# Patient Record
Sex: Male | Born: 1950
Health system: Southern US, Community
[De-identification: ages and names within clinical notes are randomized; demographics above are authoritative.]

## PROBLEM LIST (undated history)

## (undated) DIAGNOSIS — N400 Enlarged prostate without lower urinary tract symptoms: Secondary | ICD-10-CM

## (undated) DIAGNOSIS — I10 Essential (primary) hypertension: Secondary | ICD-10-CM

## (undated) DIAGNOSIS — E119 Type 2 diabetes mellitus without complications: Secondary | ICD-10-CM

## (undated) HISTORY — PX: ROTATOR CUFF REPAIR: SHX139

## (undated) HISTORY — PX: CHOLECYSTECTOMY: SHX55

---

## 2001-12-25 ENCOUNTER — Inpatient Hospital Stay (HOSPITAL_COMMUNITY): Admission: EM | Admit: 2001-12-25 | Discharge: 2001-12-30 | Payer: Self-pay | Admitting: *Deleted

## 2001-12-25 ENCOUNTER — Encounter: Payer: Self-pay | Admitting: Surgery

## 2001-12-25 ENCOUNTER — Encounter (INDEPENDENT_AMBULATORY_CARE_PROVIDER_SITE_OTHER): Payer: Self-pay | Admitting: *Deleted

## 2001-12-27 ENCOUNTER — Encounter: Payer: Self-pay | Admitting: Surgery

## 2001-12-28 ENCOUNTER — Encounter: Payer: Self-pay | Admitting: Surgery

## 2007-11-02 ENCOUNTER — Inpatient Hospital Stay (HOSPITAL_COMMUNITY): Admission: AD | Admit: 2007-11-02 | Discharge: 2007-11-07 | Payer: Self-pay | Admitting: Internal Medicine

## 2008-09-12 ENCOUNTER — Emergency Department (HOSPITAL_COMMUNITY): Admission: EM | Admit: 2008-09-12 | Discharge: 2008-09-12 | Payer: Self-pay | Admitting: Family Medicine

## 2011-03-15 NOTE — Discharge Summary (Signed)
NAME:  Brian Savage, Brian Savage NO.:  [id number]   MEDICAL RECORD NO.:  [id number]          PATIENT TYPE:  INP   LOCATION:  5114                         FACILITY:  [facility]   PHYSICIAN:  Thora Lance, M.D.  DATE OF BIRTH:  Jul 09, 1951   DATE OF ADMISSION:  11/02/2007  DATE OF DISCHARGE:  11/07/2007                               DISCHARGE SUMMARY   REASON FOR ADMISSION:  A 60 year old white male who presented with a  five day history of fever, shortness of breath, cough.  He was seen in  our office and found to have a temperature of 103 and bilateral lower  lobe infiltrates on chest x-ray.   PHYSICAL EXAMINATION:  VITAL SIGNS:  Blood pressure 130/70, temperature  103.  CHEST:  Decreased breath sounds and rhonchi at the bases.  HEART:  Regular rate and rhythm.  ABDOMEN: Nontender.  EXTREMITIES:  No edema.   CLINICAL DATA:  Chest x-ray showed a lingular consolidation consistent  with pneumonia.   HOSPITAL COURSE:  PNEUMONIA:  The patient was admitted for a left lung  pneumonia.  He had some mild hyponatremia with sodium of 128 on  admission.  This was treated with saline and resolved.  He was placed on  IV Rocephin and azithromycin as well as nebulizer's and oxygen.  He  gradually got better an defervesced by November 05, 2006. His white count  normalized.  His sputum culture grew out Moraxella catarrhalis, beta  lactamase positive and he was switched to Zosyn and continued on  azithromycin.  At discharge he was ambulating well without dyspnea. He  was afebrile and feeling much better. He had saturation's in the mid  90's on room air.   DISCHARGE DIAGNOSES:  1. Left lung pneumonia.  2. Hypertension.   PROCEDURE:  None.   DISCHARGE MEDICATIONS:  1. Augmentin 875 mg one p.o. q.12h., 5 days.  2. Azithromycin 500 mg one p.o. daily, 2 days.  3. Micardis/hydrochlorothiazide one a day.  4. Aspirin once a day.   DISPOSITION:  Discharged to home.   FOLLOWUP:  One week  with Dr. Valentina Lucks.   DIET:  Low sodium diet.   ACTIVITY:  As tolerated.           ______________________________  Thora Lance, M.D.     JJG/MEDQ  D:  11/07/2007  T:  11/07/2007  Job:  [id number]

## 2011-03-15 NOTE — H&P (Signed)
NAME:  Brian Savage, Brian Savage NO.:  [id number]   MEDICAL RECORD NO.:  [id number]          PATIENT TYPE:  INP   LOCATION:  5114                         FACILITY:  [facility]   PHYSICIAN:  Deirdre Peer. Polite, M.D. DATE OF BIRTH:  [birth year]   DATE OF ADMISSION:  11/02/2007  DATE OF DISCHARGE:                              HISTORY & PHYSICAL   CHIEF COMPLAINT:  Fever, shortness of breath.   HISTORY OF PRESENT ILLNESS:  A 60 year old male with known history of  hypertension, obesity and glucose intolerance who presents to the office  with above chief complaint.  The patient has been having those symptoms  for approximately 5 days.  He has tried several over-the-counter  medicine without improvement.  He is having productive cough, having  pain in his chest because of excessive coughing.  In the office he is  evaluated.  Temperature is 103, weight 254, pulse 100, and BP 130/70.  He had an x-ray with bilateral lower lobe infiltrate.  Admission was  deemed necessary for further evaluation and treatment.  Please note, the  patient for the most part has not had any sick contacts; however, has  been traveling a lot recently and in fact had a flight to [place].   PAST MEDICAL HISTORY:  As stable above.   MEDICATIONS:  1. Micardis HCT 80/25 daily.  2. Multivitamin daily.  3. Aspirin daily.   SOCIAL HISTORY:  Negative for tobacco.  Social alcohol, no drugs.   ALLERGIES:  None.   PAST SURGICAL HISTORY:  Fractured left collar bone in 1985, open  cholecystectomy in 2003.   REVIEW OF SYSTEMS:  As stated in the HPI.   FAMILY HISTORY:  Father with diabetes.  Mother deceased from stroke;  also had hypertension, diabetes.  Several siblings with hypertension.   PHYSICAL EXAMINATION:  The patient in moderate distress with paroxysms  of cough.  BP 130/70, temperature 103.  HEENT:  Anicteric sclerae, moist oral mucosa.  NECK:  Supple, no adenopathy.  CHEST:  Decreased breath sounds and  rhonchi in the base.  CARDIOVASCULAR:  Regular, no S3.  ABDOMEN:  Obese, nontender.  EXTREMITIES:  No edema.  NEUROLOGIC:  Nonfocal.   Chest x-ray:  Stated in HPI.   ASSESSMENT:  1. Pneumonia.  2. Fever.  3. Hypoxia, 88% on room air in the office.  4. Hypertension.  5. Obesity.   RECOMMENDATIONS:  The patient admitted to the hospital, will be started  on antibiotics, O2 nebulizers.  Will have a followup chest x-ray.  Will  have labs CBC, CMET, and the patient will be pancultured.  With the  patient's temperature being as high a 103, will also add nasopharyngeal  swab for influenza.      Deirdre Peer. Polite, M.D.  Electronically Signed     RDP/MEDQ  D:  11/02/2007  T:  11/02/2007  Job:  [id number]

## 2011-03-18 NOTE — Procedures (Signed)
[facility]  Patient:    Brian Savage, Brian Savage Visit Number: [id number] MRN: [id number]          Service Type: SUR Location: 4W 0449 01 Attending Physician:  Bonnetta Barry Dictated by:   Petra Kuba, M.D. Proc. Date: 12/28/01 Admit Date:  12/25/2001   CC:         Thora Lance, M.D.  Velora Heckler, M.D.   Procedure Report  PROCEDURE:  Endoscopic retrograde cholangiopancreatogram with sphincterotomy stone extraction.  INDICATION:  CBD stones on intraoperative cholangiogram. Consent was signed after risks, benefits, methods, and options were thoroughly discussed by both Dr. Luther Parody and Dr. Gerrit Friends February 27 and Dr. Ewing Schlein before any premedications given.  MEDICINES USED:  Demerol 60 mg, Versed 6 mg.  DESCRIPTION OF PROCEDURE:  The side-viewing therapeutic video duodenoscope was inserted by indirect vision into the stomach. There was some NG trauma seen. Some minimal bilious secretions were suctioned. The scope was then advanced through a normal antrum, normal pylorus, into the duodenum and a normal appearing ampulla was brought into view. On the first attempt using the triple lumen sphincter tome, deep selective cannulation was obtained. The CBD was normal upon the first injection without obvious stones. The JAG wire was advanced into the intraphepatics and we went ahead and proceeded with a moderate size sphincterotomy until excellent biliary drainage was seen and until we could advance the fully _______ sphincterotome in and out of the duct easily. We then went ahead and exchanged this sphincterotome for an 8.5-mm balloon and on three balloon pullthroughs, no abnormalities were seen or delivered. The balloon was withdrawn with no more resistance through the sphincterotomy site. We did do an occlusion cholangiogram which was normal. Dr. Kara Pacer reviewed the films with [place] after the procedure without any obvious residual stones or  abnormalities. After the third balloon pullthrough, the wire was withdrawn, the scope removed, the patient tolerated the procedure well, and there was no obvious immediate complication.  ENDOSCOPIC DIAGNOSES: 1. Nasogastric trauma in the stomach. 2. Normal ampulla. 3. Normal common bile duct and intrahepatics without obvious being seen. 4. No pancreatic duct injections.  THERAPY:  Moderate size sphincterotomy with three balloon pullthroughs using the 8.5 mm balloon and a negative occlusion cholangiogram.  PLAN:  Customary post ERCP orders. Will hold NG tube for now. I have discussed that with Dr. Gerrit Friends but have a low threhhold for replacing. Continue antibiotics for at least one more day and recheck labs in the a.m. and otherwise, further workup and plans per Dr. Gerrit Friends and the customary postoperative care. Dictated by:   Petra Kuba, M.D. Attending Physician:  Bonnetta Barry DD:  12/28/01 TD:  12/30/01 Job: [id number]

## 2011-03-18 NOTE — Op Note (Signed)
[facility]  Patient:    Brian Savage, Brian Savage Visit Number: [id number] MRN: [id number]          Service Type: SUR Location: 4W 0449 01 Attending Physician:  Bonnetta Barry Dictated by:   Velora Heckler, M.D. Proc. Date: 12/27/01 Admit Date:  12/25/2001   CC:         Thora Lance, M.D.   Operative Report  PREOPERATIVE DIAGNOSES:  Cholelithiasis, biliary pancreatitis.  POSTOPERATIVE DIAGNOSES:  Cholelithiasis, subacute cholecystitis, biliary pancreatitis, choledocholithiasis.  PROCEDURE: 1. Attempted laparoscopic cholecystectomy with intraoperative cholangiography. 2. Open cholecystectomy with intraoperative cholangiography.  SURGEON:  Velora Heckler, M.D.  ASSISTANT:  Adolph Pollack, M.D.  ANESTHESIA:  General.  ESTIMATED BLOOD LOSS:  Less than 200 cc.  PREPARATION:  Betadine.  COMPLICATIONS:  None.  INDICATIONS:  The patient is a 60 year old black male, who developed his first episode of biliary colic and subsequent biliary pancreatitis during a recent trip to [place].  He was treated conservatively with bowel rest and intravenous hydration.  The patient returned to the [place] and was seen by general surgery.  He was prepared for elective cholecystectomy on an outpatient basis.  However, in the intervening weeks, the patient developed a second episode of biliary pancreatitis.  The patient was admitted to [facility] on December 25, 2001, and treated with bowel rest and intravenous hydration.  The patient was prepared and brought to the operating room today for cholecystectomy.  DESCRIPTION OF PROCEDURE:  The procedure [facility] in [facility].  The patient is brought to the operating room and placed in a supine position on the operating room table.  Following administration of general anesthesia, the patient is prepped and draped in the usual strict aseptic fashion.  After  ascertaining that an adequate level of anesthesia had been obtained, an infraumbilical incision was made with a #15 blade. Dissection is carried down to the fascia.  The fascia is incised in the midline, and the peritoneal cavity is entered cautiously.  An 0 Vicryl pursestring suture is placed in the fascia.  An Hasson cannula is introduced under direct vision and secured with the pursestring suture.  The abdomen is insufflated with carbon dioxide.  Laparoscope is introduced under direct visualization, and the abdomen is explored.  Operative ports are placed along the right costal margin in the midline, mid clavicular line, and anterior axillary line.  Fundus of the gallbladder is identified.  There are adhesions of the omentum to the undersurface of the liver.  These are carefully taken down with blunt dissection and hemostasis obtained with the electrocautery. The fundus of the gallbladder is grasped and retracted cephalad.  Omental adhesions to the gallbladder are gently taken down using blunt dissection and hemostasis again obtained with the electrocautery.  Dissection is begun at the neck of the gallbladder.  Venous tributaries are divided between Ligaclips. The neck of the gallbladder is gently dissected out, mainly with blunt dissection.  A structure resembling the cystic duct is dissected out.  It is moderately dilated.  After opening up the triangle of Calot sufficiently to convince ourselves that this represented the cystic duct, a clip was placed at the neck of the gallbladder.  Cystic duct was incised.  Numerous small, subcentimeter gallstones were extracted from the cystic duct.  A Cook cholangiography catheter is introduced through the skin in the right upper quadrant of the abdominal wall.  It is inserted into the cystic duct  and secured with a Ligaclip.  Using C-arm fluoroscopy, real-time cholangiography is performed.  There was filling of the cystic duct, containing  numerous gallstones.  Common bile duct was not visualized.  There was reflux of contrast into the gallbladder.  At this point, a decision was made to open the patient in order to better define the anatomy and rule out potential common bile duct injury.  Therefore, pneumoperitoneum was released.  All ports were removed.  Cook catheter was removed.  The 0 Vicryl pursestring suture at the umbilicus was tied securely.  The patient was repositioned.  Instruments were reset.  Right subcostal incision was made with a #10 blade.  Dissection was carried down through the subcutaneous tissues.  Fascia was incised.  Rectus muscle was divided with the electrocautery.  Posterior rectus fascia was incised, and the peritoneal cavity was entered cautiously.  Previous irrigation fluid was evacuated.  Thompson retractor was placed for exposure. Gallbladder was examined directly.  Indeed the anatomy was, as suspected, that the gallbladder had been dissected out down to the neck of the gallbladder. Cystic duct had been clipped and incised.  The cystic duct still contained numerous gallstones which were extracted.  Gallbladder was mobilized from its fundus.  It was excised from the gallbladder bed.  Cystic artery was controlled with a 3-0 silk ligature.  Gallbladder was completely mobilized down to the cystic duct.  A clamp was placed at the neck of the gallbladder to prevent further spillage of gallstones.  Using a Vanderbilt clamp, the cystic duct was progressively dilated.  Approximately four further chololiths were removed from the cystic duct, and there was free flow of bile then emanating from the cystic duct.  Cook cholangiography catheter was inserted into the cystic duct and secured with a Ligaclip.  Using C-arm fluoroscopy, real-time cholangiography was performed.  The cystic duct was quite circuitous.  The common bile duct was filled with contrast into the left and right ductal systems.  There were  numerous filling defects distally in the common bile  duct.  With some pressure on the contrast, there was flow of contrast into the duodenum.  Cook catheter was withdrawn.  Decision was made not to perform common bile duct exploration given the severity of inflammatory change due to underlying pancreatitis.  Therefore, the cystic duct was doubly ligated with 2-0 silk ligatures, and the gallbladder was excised completely.  Right upper quadrant was irrigated copiously with warm saline which was evacuated.  Good hemostasis was noted.  Three chololiths were removed from the peritoneal cavity.  Nasogastric tube was properly positioned within the stomach. Abdominal wall was closed in two layers with running #1 PDS suture.  The subcutaneous tissues were irrigated.  Skin was closed with stainless steel staples at all abdominal wounds.  The wounds were washed and dried, and sterile dressings were applied.  The patient was awakened from anesthesia and brought to the recovery room in stable condition.  The patient tolerated the procedure well.     Dictated by:   Velora Heckler, M.D. Attending Physician:  Bonnetta Barry DD:  12/27/01 TD:  12/27/01 Job: [id number]

## 2011-03-18 NOTE — Discharge Summary (Signed)
[facility]  Patient:    Brian Savage, Brian Savage Visit Number: [id number] MRN: [id number]          Service Type: SUR Location: 4W 0449 01 Attending Physician:  Bonnetta Barry Dictated by:   Velora Heckler, M.D. Admit Date:  12/25/2001 Discharge Date: 12/30/2001                             Discharge Summary  REASON FOR ADMISSION:  Recurrent biliary pancreatitis.  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  Patient had experienced an initial bout of biliary pancreatitis while traveling in [place].  [place] his [place] the [place], he was seen in my general surgery practice for elective cholecystectomy. Patient was scheduled for surgery in early March 2003.  However, on the evening of December 25, 2001, he presented to the emergency room at [facility] with abdominal pain.  Evaluation showed an elevated lipase and elevated liver function test.  Patient was admitted to the general surgical service.  HOSPITAL COURSE:  Patient was admitted on the general surgical service and placed on intravenous hydration.  Patient had improvement in his abdominal pain.  Liver function tests began to return towards normal levels as did his amylase and lipase levels.  The patient was therefore prepared and taken to the operating room on February 27.  He underwent attempted laparoscopic cholecystectomy with conversion to open cholecystectomy with intraoperative cholangiography.  He was found to have choledocholithiasis at the time of operation.  Patient was seen postoperatively by Dr. Roosvelt Harps from the gastroenterology service.  Patient was prepared and underwent endoscopic retrograde cholangiopancreatography with sphincterotomy and stone extraction on February 28.  Patients postoperative course was uneventful.  LFTs returned towards normal levels.  The patient became asymptomatic and  tolerated a diet. He was prepared for discharge home on December 30, 2001.  DISCHARGE PLANNING:  Patient is discharged home December 30, 2001, in good condition, tolerating a low-fat diet, and ambulating independently.  Patient will be seen back in my office at [facility] in one week. Discharge medications include Tylox as needed for pain.  FINAL DIAGNOSIS:  Acute cholecystitis, cholelithiasis, choledocholithiasis, biliary pancreatitis.  CONDITION AT DISCHARGE:  Improved. Dictated by:   Velora Heckler, M.D. Attending Physician:  Bonnetta Barry DD:  01/23/02 TD:  01/23/02 Job: [id number]

## 2011-03-18 NOTE — Consult Note (Signed)
[facility]  Patient:    Brian Savage, PIDCOCK Visit Number: [id number] MRN: [id number]          Service Type: SUR Location: 4W 0449 01 Attending Physician:  Bonnetta Barry Dictated by:   Roosvelt Harps, M.D. Proc. Date: 12/27/01 Admit Date:  12/25/2001   CC:         Velora Heckler, M.D.  Thora Lance, M.D.  Florencia Reasons, M.D.   Consultation Report  HISTORY:  Mr. Walker Shadow is a 60 year old male with a reported history of recurrent gallstone pancreatitis with an ultrasound that showed a gallbladder filled with multiple large stones.  He was admitted for recurrent pancreatitis and underwent an attempt at a laparoscopic cholecystectomy when the pancreatitis had seemingly resolved this morning.  Laparoscopic procedure was unsuccessful and it was converted to an open cholecystectomy.  By report intraoperative cholangiogram demonstrated that there were still stones within the common bile duct which could not be removed surgically.  Patient currently is in moderate pain.  Laboratory tests reveal normal amylase, lipase, and bilirubin.  His SGOT has decreased to 33 from 168 and his SGPT to 185 from 483.  White blood count is 12.7.  Prothrombin time is normal.  PAST MEDICAL HISTORY:  Fairly unremarkable except for some orthopedic injuries.  MEDICATIONS:  Outpatient medications were none.  No hospital patient is currently on pain medications and cefazolin IV.  SOCIAL HISTORY:  He is married.  He is a Veterinary surgeon at [employer]. He does not drink and he is a nonsmoker.  FAMILY HISTORY:  Pertinent for cholelithiasis in his mother.  There is no known family history of gastrointestinal neoplasia or inflammatory bowel disease.  REVIEW OF SYSTEMS:  GENERAL:  No weight loss or night sweats.  ENDOCRINE:  No history of diabetes or thyroid problems.  SKIN:  No rash or pruritus.  HEENT: Eyes:  No icterus or change in vision.  ENT:  No aphthous  ulcers or chronic sore throat.  RESPIRATORY:  No shortness of breath, cough, or wheezing. CARDIAC:  No chest pain, palpitations, or history of valvular heart disease. GASTROINTESTINAL:  As above.  GENITOURINARY:  No dysuria or hematuria. Remainder room of review of systems is negative.  PHYSICAL EXAMINATION  GENERAL:  He is in moderate to mild distress.  VITAL SIGNS:  Afebrile, blood pressure 156/90, pulse 103.  SKIN:  Normal.  HEENT:  Eyes:  Anicteric.  Oropharynx is unremarkable.  NECK:  Supple without thyromegaly.  No cervical or inguinal adenopathy.  CHEST:  Clear to auscultation and percussion.  HEART:  Regular rate and rhythm without murmur, rub, or gallop.  ABDOMEN:  Surgical dressings, moderate distention, and no bowel sounds.  There is mild to moderate incisional tenderness.  EXTREMITIES:  Without clubbing, cyanosis, edema, or rash.  IMPRESSION:  Otherwise healthy 60 year old male with recurrent gallstone pancreatitis status post open cholecystectomy with an intraoperative cholangiogram that suggests retained common bile duct stones.  RECOMMENDATIONS:  An ERCP for removal of these stones is in order to help prevent further pancreatitis or infection.  PLAN:  ERCP technique and potential complications including bleeding, infection, and worsening pancreatitis are discussed with the patient and his wife who agree to proceed pending his postoperative condition.  This could be scheduled tomorrow at approximately 3:30 p.m. unless there are earlier cancellations.  He should be maintained on antibiotics until completion of the ERCP. Dictated by:   Roosvelt Harps, M.D. Attending Physician:  Bonnetta Barry DD:  12/27/01 TD:  12/27/01 Job: [id number]

## 2011-07-21 LAB — BASIC METABOLIC PANEL
BUN: 6
BUN: 8
CO2: 29
Calcium: 8.2 — ABNORMAL LOW
Calcium: 8.6
GFR calc non Af Amer: >60
Glucose, Bld: 124 — ABNORMAL HIGH
Glucose, Bld: 175 — ABNORMAL HIGH
Potassium: 3.8
Sodium: 136

## 2011-07-21 LAB — INFLUENZA A+B VIRUS AG-DIRECT(RAPID)
Inflenza A Ag: NEGATIVE
Influenza B Ag: NEGATIVE

## 2011-07-21 LAB — COMPREHENSIVE METABOLIC PANEL
BUN: 11
CO2: 29
Calcium: 8.2 — ABNORMAL LOW
Chloride: 89 — ABNORMAL LOW
Creatinine, Ser: 1.19
GFR calc Af Amer: >60
GFR calc non Af Amer: >60
Glucose, Bld: 136 — ABNORMAL HIGH
Total Bilirubin: 0.6

## 2011-07-21 LAB — EXPECTORATED SPUTUM ASSESSMENT W GRAM STAIN, RFLX TO RESP C

## 2011-07-21 LAB — URINALYSIS, DIPSTICK ONLY
Bilirubin Urine: NEGATIVE
Ketones, ur: NEGATIVE
Nitrite: NEGATIVE
Specific Gravity, Urine: 1.01
Urobilinogen, UA: 1

## 2011-07-21 LAB — CBC
HCT: 36.4 — ABNORMAL LOW
HCT: 37.1 — ABNORMAL LOW
Hemoglobin: 12.3 — ABNORMAL LOW
MCHC: 33.8
MCHC: 34.7
MCV: 87.9
Platelets: 220
Platelets: 283
RBC: 4.22
RDW: 13.4
RDW: 14.1
WBC: 12.1 — ABNORMAL HIGH
WBC: 9.3

## 2011-07-21 LAB — CULTURE, RESPIRATORY W GRAM STAIN

## 2011-07-21 LAB — CULTURE, BLOOD (ROUTINE X 2)
Culture: NO GROWTH
Culture: NO GROWTH

## 2015-05-01 ENCOUNTER — Other Ambulatory Visit: Payer: Self-pay | Admitting: *Deleted

## 2015-05-01 ENCOUNTER — Ambulatory Visit (INDEPENDENT_AMBULATORY_CARE_PROVIDER_SITE_OTHER): Payer: BC Managed Care – PPO | Admitting: Internal Medicine

## 2015-05-01 DIAGNOSIS — Z9189 Other specified personal risk factors, not elsewhere classified: Secondary | ICD-10-CM

## 2015-05-01 DIAGNOSIS — Z7189 Other specified counseling: Secondary | ICD-10-CM | POA: Diagnosis not present

## 2015-05-01 DIAGNOSIS — Z23 Encounter for immunization: Secondary | ICD-10-CM | POA: Diagnosis not present

## 2015-05-01 DIAGNOSIS — Z7184 Encounter for health counseling related to travel: Secondary | ICD-10-CM

## 2015-05-01 MED ORDER — CIPROFLOXACIN HCL 500 MG PO TABS: 500.0000 mg | ORAL_TABLET | Freq: Two times a day (BID) | ORAL | Status: DC

## 2015-05-01 MED ORDER — ATOVAQUONE-PROGUANIL HCL 250-100 MG PO TABS: 1.0000 | ORAL_TABLET | Freq: Every day | ORAL | Status: DC

## 2015-05-01 NOTE — Progress Notes (Signed)
  RFV: travel to [place] for mission trip Subjective:    Patient ID: Brian Savage, male    DOB: 06/08/1951, 64 y.o.   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 He will be leaving on july8th and returning the 18th. Going with group of 23. He was last in [place] in 2014. He has not had traveler's diarrhea in the past  Allergies not on file No current outpatient prescriptions on file prior to visit.   No current facility-administered medications on file prior to visit.   Active Ambulatory Problems    Diagnosis Date Noted  . No Active Ambulatory Problems   Resolved Ambulatory Problems    Diagnosis Date Noted  . No Resolved Ambulatory Problems   No Additional Past Medical History  all: NKMA  Meds: metformin, glimerpiride, amlodipine, micardis, asa, mvi  Pmhx: diabetes, htn  Previous travel: [place], [place], [place], [place], [place]  Previous vax: flu last year, hep a, typhoid inj in 2012, meningococcal , yellow fever, dtap, mmr, small pox 2005, polio adult booster    Review of Systems     Objective:   Physical Exam There were no vitals taken for this visit.        Assessment & Plan:  Vaccines = will give him typhoid injection  Malaria proph= will give him malarone to start on July 6th thru the 25th  Mosquito bite prevention = discussed use of deet and premethrin  Traveler's diarrhea =will give rx for cipro

## 2016-09-08 DIAGNOSIS — Z7984 Long term (current) use of oral hypoglycemic drugs: Secondary | ICD-10-CM | POA: Diagnosis not present

## 2016-09-08 DIAGNOSIS — Z125 Encounter for screening for malignant neoplasm of prostate: Secondary | ICD-10-CM | POA: Diagnosis not present

## 2016-09-08 DIAGNOSIS — E119 Type 2 diabetes mellitus without complications: Secondary | ICD-10-CM | POA: Diagnosis not present

## 2016-09-08 DIAGNOSIS — I10 Essential (primary) hypertension: Secondary | ICD-10-CM | POA: Diagnosis not present

## 2016-09-08 DIAGNOSIS — Z23 Encounter for immunization: Secondary | ICD-10-CM | POA: Diagnosis not present

## 2016-09-08 DIAGNOSIS — Z0000 Encounter for general adult medical examination without abnormal findings: Secondary | ICD-10-CM | POA: Diagnosis not present

## 2017-01-04 DIAGNOSIS — I10 Essential (primary) hypertension: Secondary | ICD-10-CM | POA: Diagnosis not present

## 2017-01-04 DIAGNOSIS — Z7984 Long term (current) use of oral hypoglycemic drugs: Secondary | ICD-10-CM | POA: Diagnosis not present

## 2017-01-04 DIAGNOSIS — E119 Type 2 diabetes mellitus without complications: Secondary | ICD-10-CM | POA: Diagnosis not present

## 2017-01-04 DIAGNOSIS — R972 Elevated prostate specific antigen [PSA]: Secondary | ICD-10-CM | POA: Diagnosis not present

## 2017-02-07 DIAGNOSIS — E119 Type 2 diabetes mellitus without complications: Secondary | ICD-10-CM | POA: Diagnosis not present

## 2017-02-07 DIAGNOSIS — Z7984 Long term (current) use of oral hypoglycemic drugs: Secondary | ICD-10-CM | POA: Diagnosis not present

## 2017-04-25 ENCOUNTER — Encounter: Payer: BC Managed Care – PPO | Attending: Internal Medicine | Admitting: Registered"

## 2017-04-25 DIAGNOSIS — Z713 Dietary counseling and surveillance: Secondary | ICD-10-CM | POA: Diagnosis not present

## 2017-04-25 DIAGNOSIS — E118 Type 2 diabetes mellitus with unspecified complications: Secondary | ICD-10-CM

## 2017-04-25 DIAGNOSIS — E119 Type 2 diabetes mellitus without complications: Secondary | ICD-10-CM | POA: Insufficient documentation

## 2017-04-25 NOTE — Patient Instructions (Addendum)
Check out websites of restaurants and look for nutrition facts for menu items. For the morning high BG you can try having a bedtime snack with a carbohydrate and protein. The other option to help bring down would be to adjust when you take medication - work with your doctor if interested. If BG high, for breakfast think about have more protein. Aim for 3-4 Carb Choices per meal (45-60 grams)   Aim for 0-2 Carbs per snack if hungry  Include protein with your meals and snacks Consider reading food labels for Total Carbohydrate Consider getting daily activity as tolerated Continue checking BG at alternate times per day as directed by MD  Continue taking medication as directed by MD

## 2017-04-25 NOTE — Progress Notes (Signed)
Diabetes Self-Management Education  Visit Type: First/Initial   Appt. Start Time: 1445 Appt. End Time: 1550  04/25/2017  [name], identified by name and date of birth, is a 66 y.o. male with a diagnosis of Diabetes: Type 2.   ASSESSMENT Pt is here with spouse. Pt states he has gym equipment at his home and uses it 2x week for strength training and aerobic exercise. Pt states concern over morning fasting high BG. RD discussed Dawn Effect and Somogyi.     Diabetes Self-Management Education - 04/25/17 1440      Visit Information   Visit Type First/Initial     Initial Visit   Diabetes Type Type 2   Are you currently following a meal plan? No   Are you taking your medications as prescribed? Yes  metformin, glimipiride   Date Diagnosed 2010 or 2011     Health Coping   How would you rate your overall health? Good     Psychosocial Assessment   Patient Belief/Attitude about Diabetes Motivated to manage diabetes   How often do you need to have someone help you when you read instructions, pamphlets, or other written materials from your doctor or pharmacy? 1 - Never   What is the last grade level you completed in school? [name]     Complications   Last HgB A1C per patient/outside source 8.1 %  per pt   How often do you check your blood sugar? 3-4 times/day   Fasting Blood glucose range (mg/dL) 174-944;967-591  [phone]   Postprandial Blood glucose range (mg/dL) 599-357   Number of hypoglycemic episodes per month 0  feels symptoms below 100 - feels tired   Number of hyperglycemic episodes per week 3   Can you tell when your blood sugar is high? No   Have you had a dilated eye exam in the past 12 months? No   Have you had a dental exam in the past 12 months? Yes   Are you checking your feet? Yes   How many days per week are you checking your feet? 7     Dietary Intake   Breakfast none OR oatmeal, black coffee OR bacon sandwich   Snack (morning) none   Lunch fast food  (tuna & veggie, water) [employer], chili [employer] pot pie    Snack (afternoon) none   Dinner [place] OR chinese OR [place] OR salmon OR fried rice   Snack (evening) ice cream OR graham crackers OR yogurt wheat germ   Beverage(s) black coffee, water, diet soda, occassionally wine 2x month     Exercise   Exercise Type Moderate (swimming / aerobic walking)   How many days per week to you exercise? 2   How many minutes per day do you exercise? 25   Total minutes per week of exercise 50     Patient Education   Previous Diabetes Education No   Disease state  Definition of diabetes, type 1 and 2, and the diagnosis of diabetes;Factors that contribute to the development of diabetes   Nutrition management  Role of diet in the treatment of diabetes and the relationship between the three main macronutrients and blood glucose level;Food label reading, portion sizes and measuring food.;Carbohydrate counting;Reviewed blood glucose goals for pre and post meals and how to evaluate the patients' food intake on their blood glucose level.   Monitoring Identified appropriate SMBG and/or A1C goals.   Chronic complications Relationship between chronic complications and blood glucose control     Individualized  Goals (developed by patient)   Nutrition Follow meal plan discussed   Physical Activity Exercise 5-7 days per week     Outcomes   Expected Outcomes Demonstrated interest in learning. Expect positive outcomes   Future DMSE PRN   Program Status Completed    Individualized Plan for Diabetes Self-Management Training:   Learning Objective:  Patient will have a greater understanding of diabetes self-management. Patient education plan is to attend individual and/or group sessions per assessed needs and concerns.   Patient Instructions  Check out websites of restaurants and look for nutrition facts for menu items. For the morning high BG you can try having a bedtime snack with a carbohydrate and protein. The other  option to help bring down would be to adjust when you take medication - work with your doctor if interested. If BG high, for breakfast think about have more protein. Aim for 3-4 Carb Choices per meal (45-60 grams)   Aim for 0-2 Carbs per snack if hungry  Include protein with your meals and snacks Consider reading food labels for Total Carbohydrate Consider getting daily activity as tolerated Continue checking BG at alternate times per day as directed by MD  Continue taking medication as directed by MD   Expected Outcomes:  Demonstrated interest in learning. Expect positive outcomes  Education material provided: Living Well with Diabetes, A1C conversion sheet, My Plate, Support group flyer and Carbohydrate counting sheet  If problems or questions, patient to contact team via:  Phone  Future DSME appointment: PRN

## 2017-06-12 DIAGNOSIS — I10 Essential (primary) hypertension: Secondary | ICD-10-CM | POA: Diagnosis not present

## 2017-06-12 DIAGNOSIS — Z7984 Long term (current) use of oral hypoglycemic drugs: Secondary | ICD-10-CM | POA: Diagnosis not present

## 2017-06-12 DIAGNOSIS — E1165 Type 2 diabetes mellitus with hyperglycemia: Secondary | ICD-10-CM | POA: Diagnosis not present

## 2017-06-12 DIAGNOSIS — E119 Type 2 diabetes mellitus without complications: Secondary | ICD-10-CM | POA: Diagnosis not present

## 2017-10-05 DIAGNOSIS — E785 Hyperlipidemia, unspecified: Secondary | ICD-10-CM | POA: Diagnosis not present

## 2017-10-05 DIAGNOSIS — Z23 Encounter for immunization: Secondary | ICD-10-CM | POA: Diagnosis not present

## 2017-10-05 DIAGNOSIS — I10 Essential (primary) hypertension: Secondary | ICD-10-CM | POA: Diagnosis not present

## 2017-10-05 DIAGNOSIS — Z6831 Body mass index (BMI) 31.0-31.9, adult: Secondary | ICD-10-CM | POA: Diagnosis not present

## 2017-10-05 DIAGNOSIS — Z0000 Encounter for general adult medical examination without abnormal findings: Secondary | ICD-10-CM | POA: Diagnosis not present

## 2017-10-05 DIAGNOSIS — E119 Type 2 diabetes mellitus without complications: Secondary | ICD-10-CM | POA: Diagnosis not present

## 2017-10-05 DIAGNOSIS — E669 Obesity, unspecified: Secondary | ICD-10-CM | POA: Diagnosis not present

## 2017-10-06 ENCOUNTER — Other Ambulatory Visit: Payer: Self-pay | Admitting: Internal Medicine

## 2017-10-06 DIAGNOSIS — E785 Hyperlipidemia, unspecified: Secondary | ICD-10-CM

## 2017-10-06 DIAGNOSIS — E119 Type 2 diabetes mellitus without complications: Secondary | ICD-10-CM

## 2017-10-12 ENCOUNTER — Other Ambulatory Visit: Payer: BC Managed Care – PPO

## 2017-10-18 ENCOUNTER — Ambulatory Visit
Admission: RE | Admit: 2017-10-18 | Discharge: 2017-10-18 | Disposition: A | Discharge status: Home / Self Care | Payer: Medicare Other | Source: Ambulatory Visit | Attending: Internal Medicine | Admitting: Internal Medicine

## 2017-10-18 DIAGNOSIS — E119 Type 2 diabetes mellitus without complications: Secondary | ICD-10-CM

## 2017-10-18 DIAGNOSIS — E785 Hyperlipidemia, unspecified: Secondary | ICD-10-CM

## 2017-12-08 DIAGNOSIS — H8123 Vestibular neuronitis, bilateral: Secondary | ICD-10-CM | POA: Diagnosis not present

## 2017-12-14 DIAGNOSIS — Z1211 Encounter for screening for malignant neoplasm of colon: Secondary | ICD-10-CM | POA: Diagnosis not present

## 2017-12-14 DIAGNOSIS — K640 First degree hemorrhoids: Secondary | ICD-10-CM | POA: Diagnosis not present

## 2018-02-01 DIAGNOSIS — I2510 Atherosclerotic heart disease of native coronary artery without angina pectoris: Secondary | ICD-10-CM | POA: Diagnosis not present

## 2018-02-01 DIAGNOSIS — I2584 Coronary atherosclerosis due to calcified coronary lesion: Secondary | ICD-10-CM | POA: Diagnosis not present

## 2018-02-01 DIAGNOSIS — H81399 Other peripheral vertigo, unspecified ear: Secondary | ICD-10-CM | POA: Diagnosis not present

## 2018-02-01 DIAGNOSIS — E119 Type 2 diabetes mellitus without complications: Secondary | ICD-10-CM | POA: Diagnosis not present

## 2018-02-01 DIAGNOSIS — Z7984 Long term (current) use of oral hypoglycemic drugs: Secondary | ICD-10-CM | POA: Diagnosis not present

## 2018-02-01 DIAGNOSIS — I10 Essential (primary) hypertension: Secondary | ICD-10-CM | POA: Diagnosis not present

## 2018-09-14 IMAGING — CT CT HEART SCORING
2 series · 16 of 20 positions shown, 18 images · non-contrast
Comparison: None.

CLINICAL DATA: Hyperlipidemia

EXAM:
CT HEART FOR CALCIUM SCORING
TECHNIQUE: CT heart was performed on a 64 channel system using prospective ECG
gating.
A non-contrast exam for calcium scoring was performed.
Note that this exam targets the heart and the chest was not imaged
in its entirety.

[Series 2: smartscore - gated 0.4 sec · axial · 0.49mm/px · z∈[-254,-149]mm · 8 of 56 slices shown, 10 images]
[im 7/56  vessel]
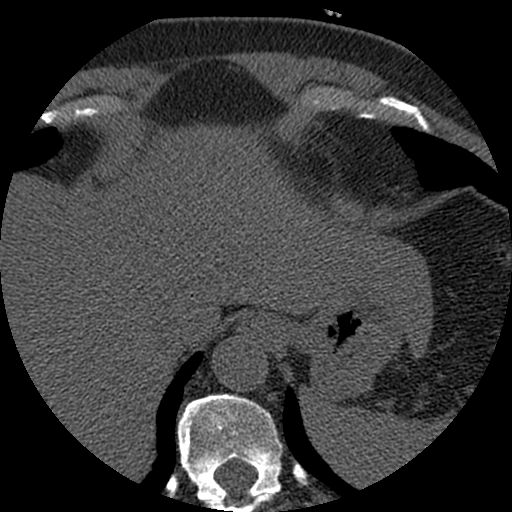
[im 7/56  lung]
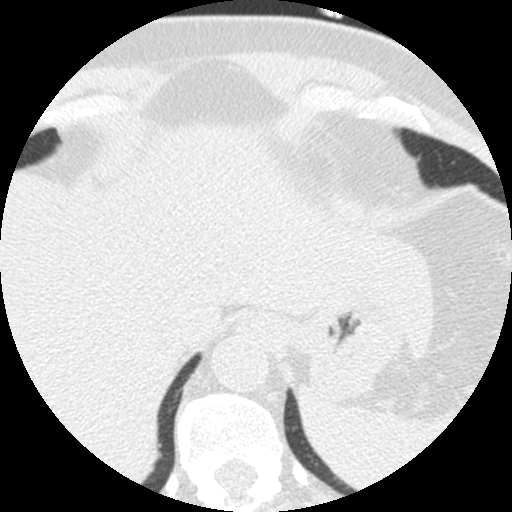
[im 13/56  vessel]
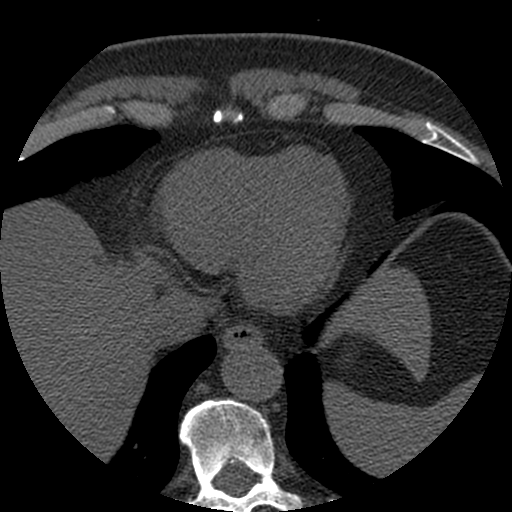
[im 19/56  vessel]
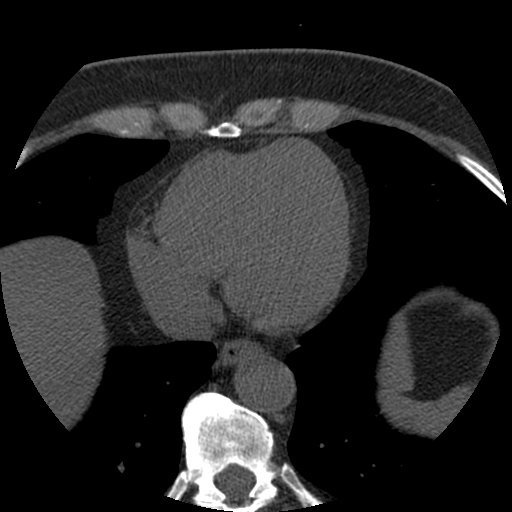
[im 25/56  vessel]
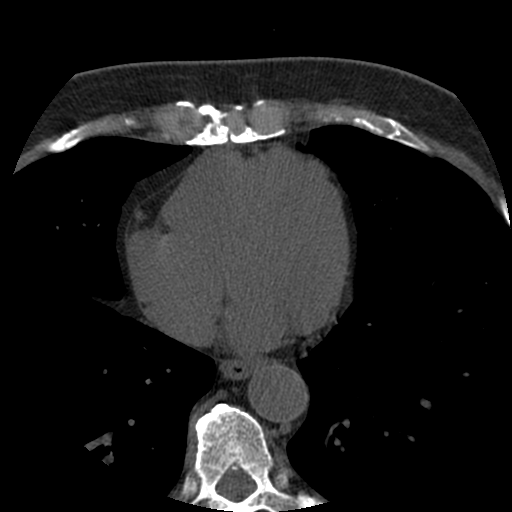
[im 31/56  vessel]
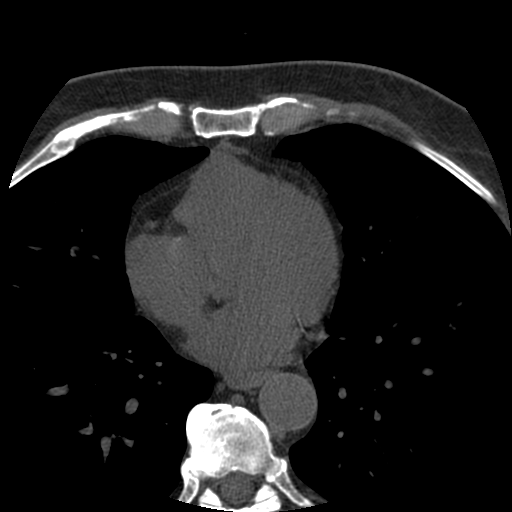
[im 31/56  lung]
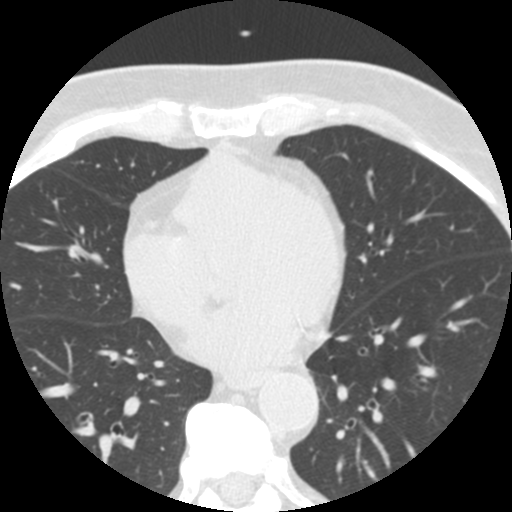
[im 37/56  vessel]
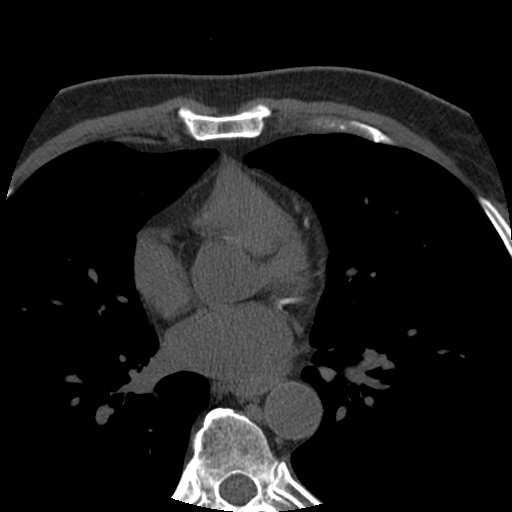
[im 43/56  vessel]
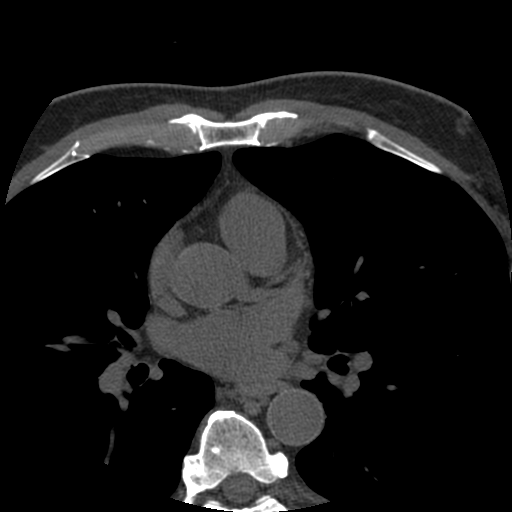
[im 49/56  vessel]
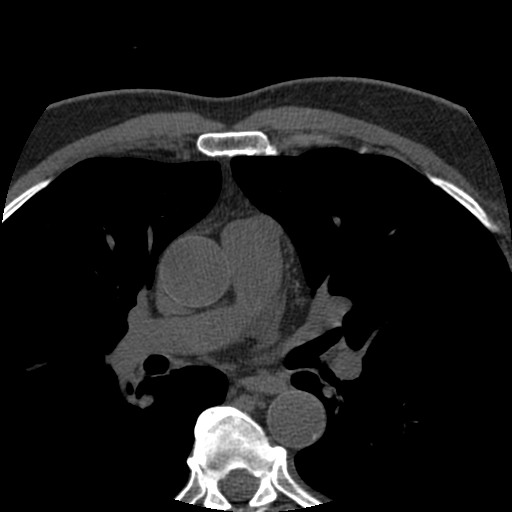

[Series 3: standard 5mm · axial · 0.70mm/px · z∈[-254,-149]mm · 8 of 56 slices shown]
[im 7/56  vessel]
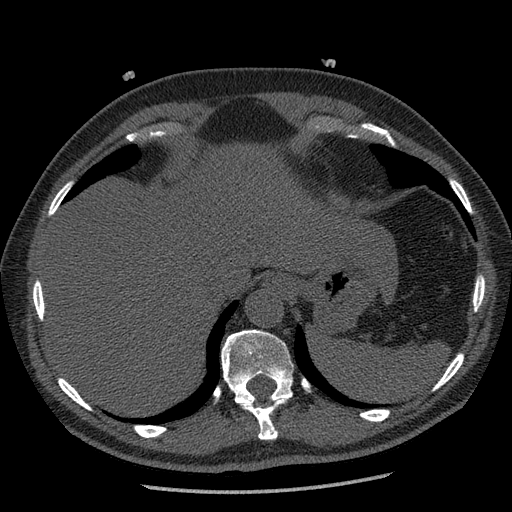
[im 13/56  vessel]
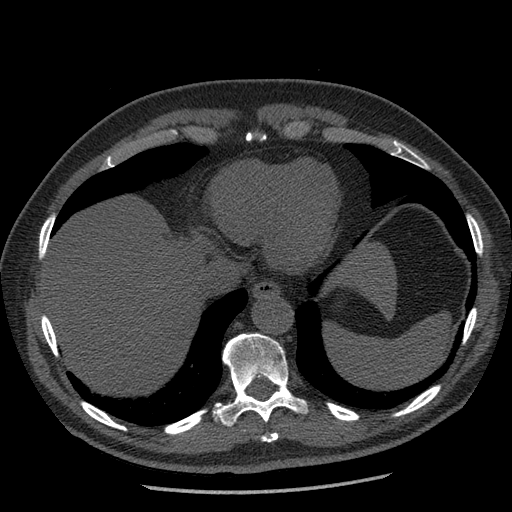
[im 19/56  vessel]
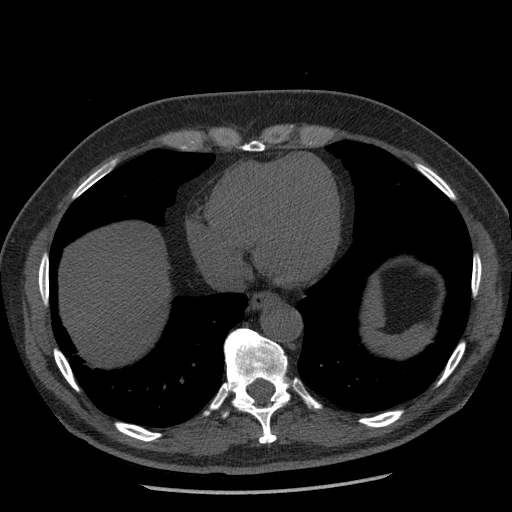
[im 25/56  vessel]
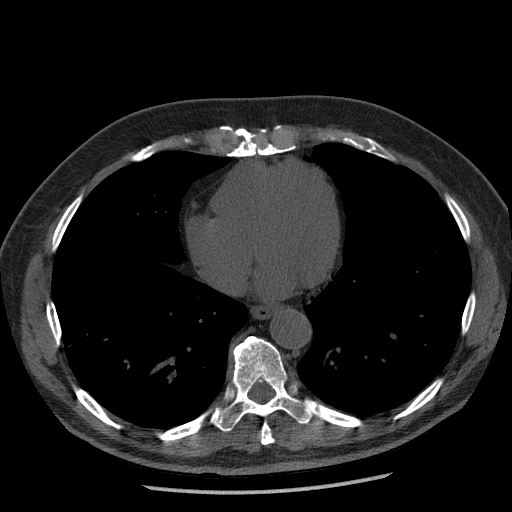
[im 31/56  vessel]
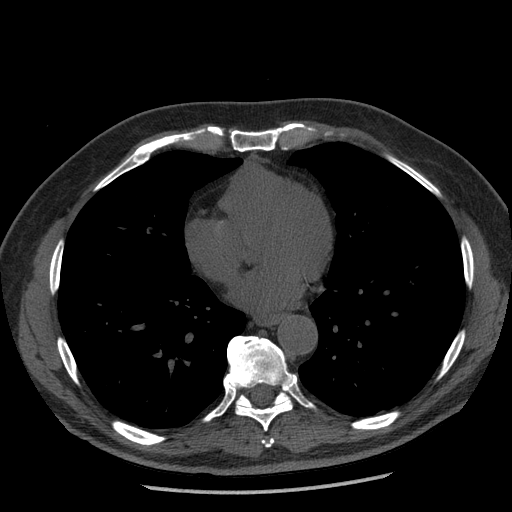
[im 37/56  vessel]
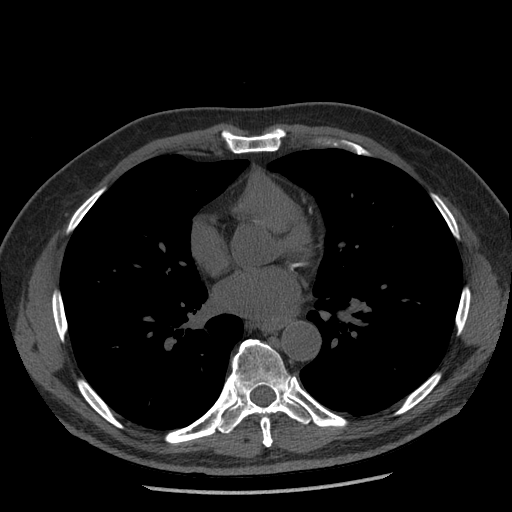
[im 43/56  vessel]
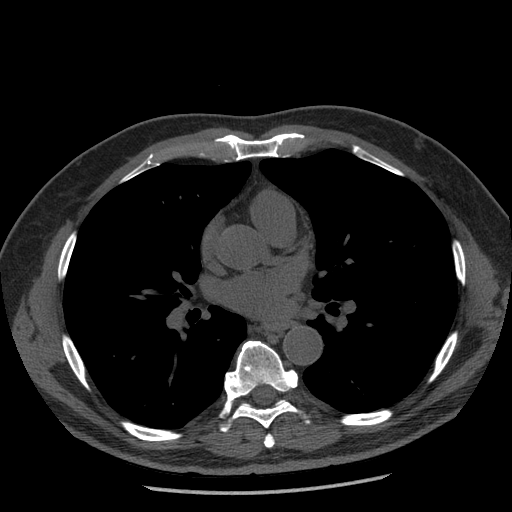
[im 49/56  vessel]
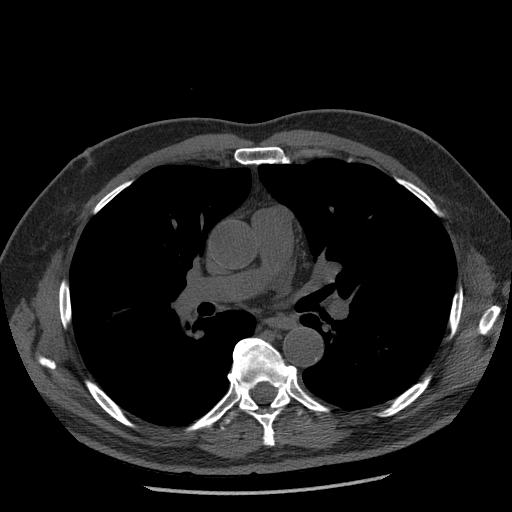

[16 of 20 positions shown; findings below may reference images not displayed]

FINDINGS: Technical quality: Good.

CORONARY CALCIUM

Total Agatston Score: 312, with calcifications in the left anterior
and left circumflex coronary arteries.

[HOSPITAL] percentile:  73

OTHER FINDINGS:

Cardiovascular: Heart is normal size. Aorta is normal caliber with
scattered calcifications.

Mediastinum/Nodes: No adenopathy in the lower mediastinum or hila.

Lungs/Pleura: No acute findings.

Upper Abdomen: Imaging into the upper abdomen shows no acute
findings.

Musculoskeletal: Chest wall soft tissues are unremarkable. No acute
bony abnormality.
IMPRESSION: Total coronary calcium score of 312 with calcifications in the left
anterior descending and left circumflex coronary arteries. This is
seventy-third percentile based on age and gender related control
group.

No acute or significant extracardiac abnormality.

## 2018-10-09 DIAGNOSIS — Z0000 Encounter for general adult medical examination without abnormal findings: Secondary | ICD-10-CM | POA: Diagnosis not present

## 2018-10-09 DIAGNOSIS — I10 Essential (primary) hypertension: Secondary | ICD-10-CM | POA: Diagnosis not present

## 2018-10-09 DIAGNOSIS — Z6831 Body mass index (BMI) 31.0-31.9, adult: Secondary | ICD-10-CM | POA: Diagnosis not present

## 2018-10-09 DIAGNOSIS — Z125 Encounter for screening for malignant neoplasm of prostate: Secondary | ICD-10-CM | POA: Diagnosis not present

## 2018-10-09 DIAGNOSIS — H9312 Tinnitus, left ear: Secondary | ICD-10-CM | POA: Diagnosis not present

## 2018-10-09 DIAGNOSIS — Z1159 Encounter for screening for other viral diseases: Secondary | ICD-10-CM | POA: Diagnosis not present

## 2018-10-09 DIAGNOSIS — E1169 Type 2 diabetes mellitus with other specified complication: Secondary | ICD-10-CM | POA: Diagnosis not present

## 2018-10-09 DIAGNOSIS — Z23 Encounter for immunization: Secondary | ICD-10-CM | POA: Diagnosis not present

## 2018-10-09 DIAGNOSIS — E785 Hyperlipidemia, unspecified: Secondary | ICD-10-CM | POA: Diagnosis not present

## 2018-10-09 DIAGNOSIS — E669 Obesity, unspecified: Secondary | ICD-10-CM | POA: Diagnosis not present

## 2018-10-16 DIAGNOSIS — E119 Type 2 diabetes mellitus without complications: Secondary | ICD-10-CM | POA: Diagnosis not present

## 2018-11-26 DIAGNOSIS — E1169 Type 2 diabetes mellitus with other specified complication: Secondary | ICD-10-CM | POA: Diagnosis not present

## 2018-11-26 DIAGNOSIS — Z7984 Long term (current) use of oral hypoglycemic drugs: Secondary | ICD-10-CM | POA: Diagnosis not present

## 2018-11-29 DIAGNOSIS — Z7984 Long term (current) use of oral hypoglycemic drugs: Secondary | ICD-10-CM | POA: Diagnosis not present

## 2018-11-29 DIAGNOSIS — E1169 Type 2 diabetes mellitus with other specified complication: Secondary | ICD-10-CM | POA: Diagnosis not present

## 2019-02-04 DIAGNOSIS — E1169 Type 2 diabetes mellitus with other specified complication: Secondary | ICD-10-CM | POA: Diagnosis not present

## 2019-02-05 DIAGNOSIS — I10 Essential (primary) hypertension: Secondary | ICD-10-CM | POA: Diagnosis not present

## 2019-02-05 DIAGNOSIS — E1169 Type 2 diabetes mellitus with other specified complication: Secondary | ICD-10-CM | POA: Diagnosis not present

## 2019-05-06 DIAGNOSIS — E1169 Type 2 diabetes mellitus with other specified complication: Secondary | ICD-10-CM | POA: Diagnosis not present

## 2019-05-08 DIAGNOSIS — R931 Abnormal findings on diagnostic imaging of heart and coronary circulation: Secondary | ICD-10-CM | POA: Diagnosis not present

## 2019-05-08 DIAGNOSIS — I10 Essential (primary) hypertension: Secondary | ICD-10-CM | POA: Diagnosis not present

## 2019-05-08 DIAGNOSIS — R972 Elevated prostate specific antigen [PSA]: Secondary | ICD-10-CM | POA: Diagnosis not present

## 2019-05-08 DIAGNOSIS — E1169 Type 2 diabetes mellitus with other specified complication: Secondary | ICD-10-CM | POA: Diagnosis not present

## 2019-10-11 DIAGNOSIS — E1169 Type 2 diabetes mellitus with other specified complication: Secondary | ICD-10-CM | POA: Diagnosis not present

## 2019-10-11 DIAGNOSIS — E785 Hyperlipidemia, unspecified: Secondary | ICD-10-CM | POA: Diagnosis not present

## 2019-10-11 DIAGNOSIS — R972 Elevated prostate specific antigen [PSA]: Secondary | ICD-10-CM | POA: Diagnosis not present

## 2019-10-11 DIAGNOSIS — I10 Essential (primary) hypertension: Secondary | ICD-10-CM | POA: Diagnosis not present

## 2019-10-11 DIAGNOSIS — I2510 Atherosclerotic heart disease of native coronary artery without angina pectoris: Secondary | ICD-10-CM | POA: Diagnosis not present

## 2019-10-11 DIAGNOSIS — Z23 Encounter for immunization: Secondary | ICD-10-CM | POA: Diagnosis not present

## 2019-10-11 DIAGNOSIS — Z0000 Encounter for general adult medical examination without abnormal findings: Secondary | ICD-10-CM | POA: Diagnosis not present

## 2019-12-18 DIAGNOSIS — R972 Elevated prostate specific antigen [PSA]: Secondary | ICD-10-CM | POA: Diagnosis not present

## 2019-12-18 DIAGNOSIS — K4090 Unilateral inguinal hernia, without obstruction or gangrene, not specified as recurrent: Secondary | ICD-10-CM | POA: Diagnosis not present

## 2019-12-26 ENCOUNTER — Ambulatory Visit: Payer: Medicare Other | Attending: Internal Medicine

## 2019-12-26 DIAGNOSIS — Z23 Encounter for immunization: Secondary | ICD-10-CM

## 2019-12-26 NOTE — Progress Notes (Signed)
   Covid-19 Vaccination Clinic  Name:  Brian Savage    MRN: [id number] DOB: 09-May-1951  12/26/2019  [name] was observed post Covid-19 immunization for 15 minutes without incidence. He was provided with Vaccine Information Sheet and instruction to access the V-Safe system.   [name] was instructed to call 911 with any severe reactions post vaccine: Marland Kitchen Difficulty breathing  . Swelling of your face and throat  . A fast heartbeat  . A bad rash all over your body  . Dizziness and weakness    Immunizations Administered    Name Date Dose VIS Date Route   Pfizer COVID-19 Vaccine 12/26/2019  4:05 PM 0.3 mL 10/11/2019 Intramuscular   Manufacturer: [employer], [employer]   Lot: [id number]   NDC: [address]-1

## 2020-01-22 ENCOUNTER — Ambulatory Visit: Payer: Medicare Other | Attending: Internal Medicine

## 2020-01-22 DIAGNOSIS — Z23 Encounter for immunization: Secondary | ICD-10-CM

## 2020-01-22 NOTE — Progress Notes (Signed)
   Covid-19 Vaccination Clinic  Name:  Brian Savage    MRN: [id number] DOB: 10-May-1951  01/22/2020  [name] was observed post Covid-19 immunization for 15 minutes without incident. He was provided with Vaccine Information Sheet and instruction to access the V-Safe system.   [name] was instructed to call 911 with any severe reactions post vaccine: Marland Kitchen Difficulty breathing  . Swelling of face and throat  . A fast heartbeat  . A bad rash all over body  . Dizziness and weakness   Immunizations Administered    Name Date Dose VIS Date Route   Pfizer COVID-19 Vaccine 01/22/2020  3:37 PM 0.3 mL 10/11/2019 Intramuscular   Manufacturer: [employer], [employer]   Lot: [id number]   NDC: [address]-1

## 2020-02-07 DIAGNOSIS — R972 Elevated prostate specific antigen [PSA]: Secondary | ICD-10-CM | POA: Diagnosis not present

## 2020-02-07 DIAGNOSIS — N411 Chronic prostatitis: Secondary | ICD-10-CM | POA: Diagnosis not present

## 2020-02-10 DIAGNOSIS — Z7984 Long term (current) use of oral hypoglycemic drugs: Secondary | ICD-10-CM | POA: Diagnosis not present

## 2020-02-10 DIAGNOSIS — E1169 Type 2 diabetes mellitus with other specified complication: Secondary | ICD-10-CM | POA: Diagnosis not present

## 2020-02-10 DIAGNOSIS — I10 Essential (primary) hypertension: Secondary | ICD-10-CM | POA: Diagnosis not present

## 2020-02-10 DIAGNOSIS — I2510 Atherosclerotic heart disease of native coronary artery without angina pectoris: Secondary | ICD-10-CM | POA: Diagnosis not present

## 2020-02-10 DIAGNOSIS — R972 Elevated prostate specific antigen [PSA]: Secondary | ICD-10-CM | POA: Diagnosis not present

## 2020-04-09 ENCOUNTER — Emergency Department (HOSPITAL_BASED_OUTPATIENT_CLINIC_OR_DEPARTMENT_OTHER)
Admission: EM | Admit: 2020-04-09 | Discharge: 2020-04-10 | Disposition: A | Discharge status: Home / Self Care | Payer: Medicare Other | Attending: Emergency Medicine | Admitting: Emergency Medicine

## 2020-04-09 ENCOUNTER — Other Ambulatory Visit: Payer: Self-pay

## 2020-04-09 ENCOUNTER — Encounter (HOSPITAL_BASED_OUTPATIENT_CLINIC_OR_DEPARTMENT_OTHER): Payer: Self-pay

## 2020-04-09 DIAGNOSIS — R339 Retention of urine, unspecified: Secondary | ICD-10-CM | POA: Insufficient documentation

## 2020-04-09 DIAGNOSIS — Z7982 Long term (current) use of aspirin: Secondary | ICD-10-CM | POA: Diagnosis not present

## 2020-04-09 DIAGNOSIS — Z7984 Long term (current) use of oral hypoglycemic drugs: Secondary | ICD-10-CM | POA: Diagnosis not present

## 2020-04-09 DIAGNOSIS — E119 Type 2 diabetes mellitus without complications: Secondary | ICD-10-CM | POA: Insufficient documentation

## 2020-04-09 DIAGNOSIS — Z79899 Other long term (current) drug therapy: Secondary | ICD-10-CM | POA: Insufficient documentation

## 2020-04-09 HISTORY — DX: Type 2 diabetes mellitus without complications: E11.9

## 2020-04-09 LAB — URINALYSIS, ROUTINE W REFLEX MICROSCOPIC
Bilirubin Urine: NEGATIVE
Glucose, UA: NEGATIVE mg/dL
Ketones, ur: NEGATIVE mg/dL
Leukocytes,Ua: NEGATIVE
Nitrite: NEGATIVE
Protein, ur: NEGATIVE mg/dL
Specific Gravity, Urine: 1.025 (ref 1.005–1.030)
pH: 6 (ref 5.0–8.0)

## 2020-04-09 LAB — URINALYSIS, MICROSCOPIC (REFLEX): WBC, UA: NONE SEEN WBC/hpf (ref 0–5)

## 2020-04-09 NOTE — ED Provider Notes (Signed)
TIME SEEN: 11:23 PM  CHIEF COMPLAINT: Urinary retention  HPI: Patient is a 69 year old male with history of hypertension, diabetes who presents to emergency department urinary retention.  States that he has always noticed that he has frequent urination but over the past 24 hours he has had a difficult time pointing to his bladder.  He reports he did have a prostate biopsy on April 9 with Dr. Diona Fanti due to elevated PSA.  Was told he did have an enlarged prostate at that time.  Urinary retention before.  Denies any changes in medications.  No back pain, numbness or weakness.  No fevers, nausea or vomiting.  ROS: See HPI Constitutional: no fever  Eyes: no drainage  ENT: no runny nose   Cardiovascular:  no chest pain  Resp: no SOB  GI: no vomiting GU: no dysuria Integumentary: no rash  Allergy: no hives  Musculoskeletal: no leg swelling  Neurological: no slurred speech ROS otherwise negative  PAST MEDICAL HISTORY/PAST SURGICAL HISTORY:  Past Medical History:  Diagnosis Date  . Diabetes mellitus without complication ([place])     MEDICATIONS:  Prior to Admission medications   Medication Sig Start Date End Date Taking? Authorizing Provider  ACCU-CHEK AVIVA PLUS test strip CHECK BLOOD SUGARS ALTERNATING BEFORE AND AFTER MEALS DAILY 04/14/15   [provider]  amLODipine (NORVASC) 5 MG tablet Take 5 mg by mouth every morning. 04/14/15   [provider]  aspirin 81 MG tablet Take 81 mg by mouth daily.    [provider]  atovaquone-proguanil (MALARONE) 250-100 MG TABS Take 1 tablet by mouth daily. Start taking on July 6th until complete 05/01/15   Carlyle Basques, MD  ciprofloxacin (CIPRO) 500 MG tablet Take 1 tablet (500 mg total) by mouth 2 (two) times daily. If you have 3 loose stools + in 24hr. Can stop taking if diarrhea resolves 05/01/15   Carlyle Basques, MD  glimepiride (AMARYL) 4 MG tablet TAKE 1 TABLET BY MOUTH WITH BREAKFAST OR 1ST MAIN MEAL OF THE DAY 04/14/15    [provider]  INVOKANA 100 MG TABS tablet 1 TABLET ONCE A DAY ORALLY 30 DAY(S) 04/14/15   [provider]  metFORMIN (GLUCOPHAGE) 1000 MG tablet Take 1,000 mg by mouth 2 (two) times daily. 04/14/15   [provider]  Multiple Vitamin (MULTIVITAMIN) tablet Take 1 tablet by mouth daily.    [provider]  telmisartan-hydrochlorothiazide (MICARDIS HCT) 80-25 MG per tablet TAKE 1 TABLET EVERY DAY 30 DAYS 04/14/15   [provider]    ALLERGIES:  No Known Allergies  SOCIAL HISTORY:  Social History   Tobacco Use  . Smoking status: Never Smoker  . Smokeless tobacco: Never Used  Substance Use Topics  . Alcohol use: Yes    FAMILY HISTORY: No family history on file.  EXAM: BP (!) 170/85 (BP Location: Left Arm)   Pulse (!) 110   Temp 98.7 F (37.1 C) (Oral)   Wt 104.3 kg   SpO2 97%  CONSTITUTIONAL: Alert and oriented and responds appropriately to questions. Well-appearing; well-nourished HEAD: Normocephalic EYES: Conjunctivae clear, pupils appear equal, EOM appear intact ENT: normal nose; moist mucous membranes NECK: Supple, normal ROM CARD: RRR; S1 and S2 appreciated; no murmurs, no clicks, no rubs, no gallops RESP: Normal chest excursion without splinting or tachypnea; breath sounds clear and equal bilaterally; no wheezes, no rhonchi, no rales, no hypoxia or respiratory distress, speaking full sentences ABD/GI: Normal bowel sounds; non-distended; soft, non-tender, no rebound, no guarding, no peritoneal  signs, no hepatosplenomegaly BACK:  The back appears normal EXT: Normal ROM in all joints; no deformity noted, no edema; no cyanosis SKIN: Normal color for age and race; warm; no rash on exposed skin NEURO: Moves all extremities equally PSYCH: The patient's mood and manner are appropriate.   MEDICAL DECISION MAKING: Patient here with urinary retention.  Had almost 900 mL in his bladder on bladder scan.  Foley catheter placed at bedside  with 1200 mL drainage.  He reports feeling much better.  Abdominal exam benign.  No systemic symptoms.  Suspect secondary to BPH.  Recommended close follow-up with Dr. Retta Diones to have Foley removed and discussion for further management of BPH.  Urine today shows no sign of infection.  Culture is pending.  Patient and wife instructed on how to care for Foley catheter at home.  They are comfortable with plan for discharge home with outpatient urology follow-up.  At this time, I do not feel there is any life-threatening condition present. I have reviewed, interpreted and discussed all results (EKG, imaging, lab, urine as appropriate) and exam findings with patient/family. I have reviewed nursing notes and appropriate previous records.  I feel the patient is safe to be discharged home without further emergent workup and can continue workup as an outpatient as needed. Discussed usual and customary return precautions. Patient/family verbalize understanding and are comfortable with this plan.  Outpatient follow-up has been provided as needed. All questions have been answered.    Brian Savage was evaluated in Emergency Department on 04/09/2020 for the symptoms described in the history of present illness. He was evaluated in the context of the global COVID-19 pandemic, which necessitated consideration that the patient might be at risk for infection with the SARS-CoV-2 virus that causes COVID-19. Institutional protocols and algorithms that pertain to the evaluation of patients at risk for COVID-19 are in a state of rapid change based on information released by regulatory bodies including the CDC and federal and state organizations. These policies and algorithms were followed during the patient's care in the ED.      Meris Reede, Layla Maw, DO 04/10/20 0030

## 2020-04-09 NOTE — ED Triage Notes (Signed)
Pt has been unable to urinate since yesterday. States he has only been able to get a dribble out.

## 2020-04-09 NOTE — ED Notes (Signed)
Foley and urine collected by Sharyl Nimrod (charge), I chaperoned and clicked off urine samples.

## 2020-04-10 NOTE — ED Notes (Signed)
  Indwelling foley catheter converted to standard leg bag.  Patient and spouse instructed on how to empty urine bag and how to secure it properly.

## 2020-04-11 LAB — URINE CULTURE: Culture: NO GROWTH

## 2020-04-13 ENCOUNTER — Emergency Department (HOSPITAL_COMMUNITY): Payer: Medicare Other

## 2020-04-13 ENCOUNTER — Ambulatory Visit (HOSPITAL_COMMUNITY): Admit: 2020-04-13 | Payer: BC Managed Care – PPO

## 2020-04-13 ENCOUNTER — Observation Stay (HOSPITAL_COMMUNITY)
Admission: EM | Admit: 2020-04-13 | Discharge: 2020-04-14 | Disposition: A | Discharge status: Home / Self Care | Payer: Medicare Other | Attending: Family Medicine | Admitting: Family Medicine

## 2020-04-13 ENCOUNTER — Encounter (HOSPITAL_COMMUNITY): Payer: Self-pay

## 2020-04-13 ENCOUNTER — Observation Stay (HOSPITAL_BASED_OUTPATIENT_CLINIC_OR_DEPARTMENT_OTHER): Payer: Medicare Other

## 2020-04-13 ENCOUNTER — Other Ambulatory Visit: Payer: Self-pay

## 2020-04-13 DIAGNOSIS — I110 Hypertensive heart disease with heart failure: Secondary | ICD-10-CM | POA: Insufficient documentation

## 2020-04-13 DIAGNOSIS — E669 Obesity, unspecified: Secondary | ICD-10-CM | POA: Diagnosis present

## 2020-04-13 DIAGNOSIS — R338 Other retention of urine: Secondary | ICD-10-CM | POA: Diagnosis not present

## 2020-04-13 DIAGNOSIS — Z6831 Body mass index (BMI) 31.0-31.9, adult: Secondary | ICD-10-CM | POA: Diagnosis not present

## 2020-04-13 DIAGNOSIS — Z7984 Long term (current) use of oral hypoglycemic drugs: Secondary | ICD-10-CM | POA: Diagnosis not present

## 2020-04-13 DIAGNOSIS — I2699 Other pulmonary embolism without acute cor pulmonale: Secondary | ICD-10-CM | POA: Diagnosis not present

## 2020-04-13 DIAGNOSIS — Z79899 Other long term (current) drug therapy: Secondary | ICD-10-CM | POA: Diagnosis not present

## 2020-04-13 DIAGNOSIS — I2609 Other pulmonary embolism with acute cor pulmonale: Secondary | ICD-10-CM

## 2020-04-13 DIAGNOSIS — J90 Pleural effusion, not elsewhere classified: Secondary | ICD-10-CM | POA: Insufficient documentation

## 2020-04-13 DIAGNOSIS — N200 Calculus of kidney: Secondary | ICD-10-CM | POA: Diagnosis not present

## 2020-04-13 DIAGNOSIS — Z20822 Contact with and (suspected) exposure to covid-19: Secondary | ICD-10-CM | POA: Diagnosis not present

## 2020-04-13 DIAGNOSIS — E1165 Type 2 diabetes mellitus with hyperglycemia: Secondary | ICD-10-CM | POA: Diagnosis not present

## 2020-04-13 DIAGNOSIS — Z7982 Long term (current) use of aspirin: Secondary | ICD-10-CM | POA: Insufficient documentation

## 2020-04-13 DIAGNOSIS — I700 Atherosclerosis of aorta: Secondary | ICD-10-CM | POA: Insufficient documentation

## 2020-04-13 DIAGNOSIS — N401 Enlarged prostate with lower urinary tract symptoms: Secondary | ICD-10-CM | POA: Insufficient documentation

## 2020-04-13 DIAGNOSIS — E119 Type 2 diabetes mellitus without complications: Secondary | ICD-10-CM | POA: Diagnosis present

## 2020-04-13 DIAGNOSIS — N400 Enlarged prostate without lower urinary tract symptoms: Secondary | ICD-10-CM | POA: Diagnosis present

## 2020-04-13 DIAGNOSIS — E66811 Obesity, class 1: Secondary | ICD-10-CM | POA: Diagnosis present

## 2020-04-13 DIAGNOSIS — I5040 Unspecified combined systolic (congestive) and diastolic (congestive) heart failure: Secondary | ICD-10-CM | POA: Diagnosis present

## 2020-04-13 DIAGNOSIS — I10 Essential (primary) hypertension: Secondary | ICD-10-CM | POA: Diagnosis present

## 2020-04-13 HISTORY — DX: Essential (primary) hypertension: I10

## 2020-04-13 HISTORY — DX: Benign prostatic hyperplasia without lower urinary tract symptoms: N40.0

## 2020-04-13 LAB — HIV ANTIBODY (ROUTINE TESTING W REFLEX): HIV Screen 4th Generation wRfx: NONREACTIVE

## 2020-04-13 LAB — COMPREHENSIVE METABOLIC PANEL
ALT: 28 U/L (ref 0–44)
AST: 26 U/L (ref 15–41)
Albumin: 3.5 g/dL (ref 3.5–5.0)
Alkaline Phosphatase: 61 U/L (ref 38–126)
Anion gap: 13 (ref 5–15)
BUN: 24 mg/dL — ABNORMAL HIGH (ref 8–23)
CO2: 23 mmol/L (ref 22–32)
Calcium: 9.1 mg/dL (ref 8.9–10.3)
Chloride: 98 mmol/L (ref 98–111)
Creatinine, Ser: 1.44 mg/dL — ABNORMAL HIGH (ref 0.61–1.24)
GFR calc Af Amer: 57 mL/min — ABNORMAL LOW (ref >60)
GFR calc non Af Amer: 50 mL/min — ABNORMAL LOW (ref >60)
Glucose, Bld: 254 mg/dL — ABNORMAL HIGH (ref 70–99)
Potassium: 4.2 mmol/L (ref 3.5–5.1)
Sodium: 134 mmol/L — ABNORMAL LOW (ref 135–145)
Total Bilirubin: 0.9 mg/dL (ref 0.3–1.2)
Total Protein: 7.3 g/dL (ref 6.5–8.1)

## 2020-04-13 LAB — BRAIN NATRIURETIC PEPTIDE: B Natriuretic Peptide: 426.3 pg/mL — ABNORMAL HIGH (ref 0.0–100.0)

## 2020-04-13 LAB — CBC
HCT: 40.9 % (ref 39.0–52.0)
Hemoglobin: 13.5 g/dL (ref 13.0–17.0)
MCH: 30.6 pg (ref 26.0–34.0)
MCHC: 33 g/dL (ref 30.0–36.0)
MCV: 92.7 fL (ref 80.0–100.0)
Platelets: 230 10*3/uL (ref 150–400)
RBC: 4.41 MIL/uL (ref 4.22–5.81)
RDW: 13.2 % (ref 11.5–15.5)
WBC: 13.6 10*3/uL — ABNORMAL HIGH (ref 4.0–10.5)
nRBC: 0 % (ref 0.0–0.2)

## 2020-04-13 LAB — TROPONIN I (HIGH SENSITIVITY)
Troponin I (High Sensitivity): 175 ng/L (ref <18)
Troponin I (High Sensitivity): 174 ng/L (ref <18)

## 2020-04-13 LAB — CBG MONITORING, ED: Glucose-Capillary: 272 mg/dL — ABNORMAL HIGH (ref 70–99)

## 2020-04-13 LAB — GLUCOSE, CAPILLARY: Glucose-Capillary: 173 mg/dL — ABNORMAL HIGH (ref 70–99)

## 2020-04-13 LAB — SARS CORONAVIRUS 2 BY RT PCR (HOSPITAL ORDER, PERFORMED IN ~~LOC~~ HOSPITAL LAB): SARS Coronavirus 2: NEGATIVE

## 2020-04-13 LAB — HEMOGLOBIN A1C
Hgb A1c MFr Bld: 7.6 % — ABNORMAL HIGH (ref 4.8–5.6)
Mean Plasma Glucose: 171.42 mg/dL

## 2020-04-13 LAB — HEPARIN LEVEL (UNFRACTIONATED): Heparin Unfractionated: <0.1 IU/mL — ABNORMAL LOW (ref 0.30–0.70)

## 2020-04-13 MED ORDER — HEPARIN BOLUS VIA INFUSION
5000.0000 [IU] | Freq: Once | INTRAVENOUS | Status: AC
  Administered 2020-04-13: 5000 [IU] via INTRAVENOUS
  Filled 2020-04-13: qty 5000

## 2020-04-13 MED ORDER — HEPARIN BOLUS VIA INFUSION
4000.0000 [IU] | Freq: Once | INTRAVENOUS | Status: AC
  Administered 2020-04-13: 4000 [IU] via INTRAVENOUS
  Filled 2020-04-13: qty 4000

## 2020-04-13 MED ORDER — CHLORHEXIDINE GLUCONATE CLOTH 2 % EX PADS
6.0000 | MEDICATED_PAD | Freq: Every day | CUTANEOUS | Status: DC
  Administered 2020-04-13 – 2020-04-14 (×2): 6 via TOPICAL

## 2020-04-13 MED ORDER — ACETAMINOPHEN 650 MG RE SUPP: 650.0000 mg | Freq: Four times a day (QID) | RECTAL | Status: DC | PRN

## 2020-04-13 MED ORDER — MORPHINE SULFATE (PF) 2 MG/ML IV SOLN: 2.0000 mg | INTRAVENOUS | Status: DC | PRN

## 2020-04-13 MED ORDER — HEPARIN (PORCINE) 25000 UT/250ML-% IV SOLN
1700.0000 [IU]/h | INTRAVENOUS | Status: DC
  Administered 2020-04-13: 1300 [IU]/h via INTRAVENOUS
  Administered 2020-04-13: 1700 [IU]/h via INTRAVENOUS
  Filled 2020-04-13 (×2): qty 250

## 2020-04-13 MED ORDER — ONDANSETRON HCL 4 MG/2ML IJ SOLN: 4.0000 mg | Freq: Four times a day (QID) | INTRAMUSCULAR | Status: DC | PRN

## 2020-04-13 MED ORDER — HYDRALAZINE HCL 20 MG/ML IJ SOLN: 5.0000 mg | INTRAMUSCULAR | Status: DC | PRN

## 2020-04-13 MED ORDER — LOSARTAN POTASSIUM 50 MG PO TABS
100.0000 mg | ORAL_TABLET | Freq: Every day | ORAL | Status: DC
  Administered 2020-04-14: 100 mg via ORAL
  Filled 2020-04-13: qty 2

## 2020-04-13 MED ORDER — INSULIN ASPART 100 UNIT/ML ~~LOC~~ SOLN: 0.0000 [IU] | Freq: Every day | SUBCUTANEOUS | Status: DC

## 2020-04-13 MED ORDER — IOHEXOL 350 MG/ML SOLN
100.0000 mL | Freq: Once | INTRAVENOUS | Status: AC | PRN
  Administered 2020-04-13: 77 mL via INTRAVENOUS

## 2020-04-13 MED ORDER — DOCUSATE SODIUM 100 MG PO CAPS
100.0000 mg | ORAL_CAPSULE | Freq: Two times a day (BID) | ORAL | Status: DC
  Administered 2020-04-14: 100 mg via ORAL
  Filled 2020-04-13 (×2): qty 1

## 2020-04-13 MED ORDER — BISACODYL 5 MG PO TBEC: 5.0000 mg | DELAYED_RELEASE_TABLET | Freq: Every day | ORAL | Status: DC | PRN

## 2020-04-13 MED ORDER — HYDROCODONE-ACETAMINOPHEN 5-325 MG PO TABS: 1.0000 | ORAL_TABLET | ORAL | Status: DC | PRN

## 2020-04-13 MED ORDER — ONDANSETRON HCL 4 MG PO TABS: 4.0000 mg | ORAL_TABLET | Freq: Four times a day (QID) | ORAL | Status: DC | PRN

## 2020-04-13 MED ORDER — AMLODIPINE BESYLATE 5 MG PO TABS
5.0000 mg | ORAL_TABLET | Freq: Every morning | ORAL | Status: DC
  Administered 2020-04-14: 5 mg via ORAL
  Filled 2020-04-13: qty 1

## 2020-04-13 MED ORDER — ZOLPIDEM TARTRATE 5 MG PO TABS
5.0000 mg | ORAL_TABLET | Freq: Every evening | ORAL | Status: DC | PRN
  Administered 2020-04-13: 5 mg via ORAL
  Filled 2020-04-13: qty 1

## 2020-04-13 MED ORDER — ACETAMINOPHEN 325 MG PO TABS: 650.0000 mg | ORAL_TABLET | Freq: Four times a day (QID) | ORAL | Status: DC | PRN

## 2020-04-13 MED ORDER — HYDROCHLOROTHIAZIDE 25 MG PO TABS
25.0000 mg | ORAL_TABLET | Freq: Every day | ORAL | Status: DC
  Administered 2020-04-14: 25 mg via ORAL
  Filled 2020-04-13: qty 1

## 2020-04-13 MED ORDER — TELMISARTAN-HCTZ 80-25 MG PO TABS: 1.0000 | ORAL_TABLET | Freq: Every day | ORAL | Status: DC

## 2020-04-13 MED ORDER — POLYETHYLENE GLYCOL 3350 17 G PO PACK: 17.0000 g | PACK | Freq: Every day | ORAL | Status: DC | PRN

## 2020-04-13 MED ORDER — INSULIN ASPART 100 UNIT/ML ~~LOC~~ SOLN
0.0000 [IU] | Freq: Three times a day (TID) | SUBCUTANEOUS | Status: DC
  Administered 2020-04-13: 8 [IU] via SUBCUTANEOUS
  Administered 2020-04-14 (×2): 3 [IU] via SUBCUTANEOUS

## 2020-04-13 NOTE — Progress Notes (Signed)
ANTICOAGULATION CONSULT NOTE - Initial Consult  Pharmacy Consult for heparin Indication: chest pain/ACS  No Known Allergies  Patient Measurements: Height: 6\' 2"  (188 cm) Weight: 110 kg (242 lb 8.1 oz) IBW/kg (Calculated) : 82.2 Heparin Dosing Weight: 104kg  Vital Signs: Temp: 98.6 F (37 C) (06/14 0550) Temp Source: Oral (06/14 0550) BP: 115/77 (06/14 0907) Pulse Rate: 100 (06/14 0907)  Labs: Recent Labs    04/13/20 0619  HGB 13.5  HCT 40.9  PLT 230  CREATININE 1.44*  TROPONINIHS 175*    Estimated Creatinine Clearance: 64.8 mL/min (A) (by C-G formula based on SCr of 1.44 mg/dL (H)).   Medical History: Past Medical History:  Diagnosis Date  . Diabetes mellitus without complication ([facility])     Assessment: 9 YOM presenting with SOB/weakness, elevated troponin.  Not on anticoagulation PTA, H/H + plts wnl.    Goal of Therapy:  Heparin level 0.3-0.7 units/ml Monitor platelets by anticoagulation protocol: Yes   Plan:  Heparin 4000 units IV x 1, and gtt at 1300 units/hr F/u 6 hour heparin level  73, PharmD Clinical Pharmacist ED Pharmacist Phone # [phone] 04/13/2020 10:22 AM

## 2020-04-13 NOTE — ED Provider Notes (Signed)
[facility] EMERGENCY DEPARTMENT Provider Note  CSN: [id number] Arrival date & time: 04/13/20 0542  Chief Complaint(s) Shortness of Breath  HPI Brian Savage is a 69 y.o. male   The history is provided by the patient.  Shortness of Breath Severity:  Moderate Duration:  1 day Timing:  Constant Progression:  Waxing and waning Chronicity:  New Context: activity   Relieved by:  Rest Worsened by:  Exertion Associated symptoms: no chest pain, no cough, no fever, no hemoptysis and no sputum production   Risk factors: no hx of PE/DVT, no prolonged immobilization (but has been on several long car rides (did stop every 2-3 hrs)) and no recent surgery     Past Medical History Past Medical History:  Diagnosis Date  . Diabetes mellitus without complication (HCC)    There are no problems to display for this patient.  Home Medication(s) Prior to Admission medications   Medication Sig Start Date End Date Taking? Authorizing Provider  ACCU-CHEK AVIVA PLUS test strip CHECK BLOOD SUGARS ALTERNATING BEFORE AND AFTER MEALS DAILY 04/14/15   [provider]  amLODipine (NORVASC) 5 MG tablet Take 5 mg by mouth every morning. 04/14/15   [provider]  aspirin 81 MG tablet Take 81 mg by mouth daily.    [provider]  atovaquone-proguanil (MALARONE) 250-100 MG TABS Take 1 tablet by mouth daily. Start taking on July 6th until complete 05/01/15   Judyann Munson, MD  ciprofloxacin (CIPRO) 500 MG tablet Take 1 tablet (500 mg total) by mouth 2 (two) times daily. If you have 3 loose stools + in 24hr. Can stop taking if diarrhea resolves 05/01/15   Judyann Munson, MD  glimepiride (AMARYL) 4 MG tablet TAKE 1 TABLET BY MOUTH WITH BREAKFAST OR 1ST MAIN MEAL OF THE DAY 04/14/15   [provider]  INVOKANA 100 MG TABS tablet 1 TABLET ONCE A DAY ORALLY 30 DAY(S) 04/14/15   [provider]  metFORMIN (GLUCOPHAGE) 1000 MG tablet Take 1,000 mg by mouth 2  (two) times daily. 04/14/15   [provider]  Multiple Vitamin (MULTIVITAMIN) tablet Take 1 tablet by mouth daily.    [provider]  telmisartan-hydrochlorothiazide (MICARDIS HCT) 80-25 MG per tablet TAKE 1 TABLET EVERY DAY 30 DAYS 04/14/15   [provider]                                                                                                                                    Past Surgical History History reviewed. No pertinent surgical history. Family History No family history on file.  Social History Social History   Tobacco Use  . Smoking status: Never Smoker  . Smokeless tobacco: Never Used  Substance Use Topics  . Alcohol use: Yes  . Drug use: Never   Allergies Patient has no known allergies.  Review of Systems Review of Systems  Constitutional: Negative for fever.  Respiratory: Positive  for shortness of breath. Negative for cough, hemoptysis and sputum production.   Cardiovascular: Negative for chest pain.   All other systems are reviewed and are negative for acute change except as noted in the HPI  Physical Exam Vital Signs  I have reviewed the triage vital signs BP 112/74 (BP Location: Right Arm)   Pulse (!) 105   Temp 98.6 F (37 C) (Oral)   Resp 16   Ht 6\' 2"  (1.88 m)   Wt 110 kg   SpO2 91%   BMI 31.14 kg/m   Physical Exam Vitals reviewed.  Constitutional:      General: He is not in acute distress.    Appearance: He is well-developed. He is not diaphoretic.  HENT:     Head: Normocephalic and atraumatic.     Nose: Nose normal.  Eyes:     General: No scleral icterus.       Right eye: No discharge.        Left eye: No discharge.     Conjunctiva/sclera: Conjunctivae normal.     Pupils: Pupils are equal, round, and reactive to light.  Cardiovascular:     Rate and Rhythm: Regular rhythm. Tachycardia present.     Heart sounds: No murmur heard.  No friction rub. No gallop.   Pulmonary:     Effort: Pulmonary effort  is normal. No respiratory distress.     Breath sounds: Normal breath sounds. No stridor. No rales.  Abdominal:     General: There is no distension.     Palpations: Abdomen is soft.     Tenderness: There is no abdominal tenderness.  Genitourinary:    Comments: Foley in place  Musculoskeletal:        General: No tenderness.     Cervical back: Normal range of motion and neck supple.     Right lower leg: No edema.     Left lower leg: No edema.  Skin:    General: Skin is warm and dry.     Findings: No erythema or rash.  Neurological:     Mental Status: He is alert and oriented to person, place, and time.     ED Results and Treatments Labs (all labs ordered are listed, but only abnormal results are displayed) Labs Reviewed  CBC - Abnormal; Notable for the following components:      Result Value   WBC 13.6 (*)    All other components within normal limits  COMPREHENSIVE METABOLIC PANEL  BRAIN NATRIURETIC PEPTIDE  TROPONIN I (HIGH SENSITIVITY)                                                                                                                         EKG  EKG Interpretation  Date/Time:  Monday April 13 2020 05:52:29 EDT Ventricular Rate:  105 PR Interval:    QRS Duration: 97 QT Interval:  347 QTC Calculation: 459 R Axis:   -174 Text Interpretation: Sinus tachycardia Right axis deviation Abnormal R-wave  progression, late transition Otherwise no significant change Confirmed by Drema Pry [id number]) on 04/13/2020 6:13:06 AM      Radiology DG Chest 2 View  Result Date: 04/13/2020 CLINICAL DATA:  Shortness of breath EXAM: CHEST - 2 VIEW COMPARISON:  11/03/2007 FINDINGS: Artifact from EKG leads. Normal heart size and mediastinal contours. No acute infiltrate or edema. No effusion or pneumothorax. No acute osseous findings. Degenerative endplate spurring with probable multilevel thoracic ankylosis. IMPRESSION: No active cardiopulmonary disease. Electronically Signed   By:  Marnee Spring M.D.   On: 04/13/2020 07:13    Pertinent labs & imaging results that were available during my care of the patient were reviewed by me and considered in my medical decision making (see chart for details).  Medications Ordered in ED Medications - No data to display                                                                                                                                  Procedures .Critical Care Performed by: Nira Conn, MD Authorized by: Nira Conn, MD    CRITICAL CARE Performed by: Amadeo Garnet Zayvien Canning Total critical care time: 40 minutes Critical care time was exclusive of separately billable procedures and treating other patients. Critical care was necessary to treat or prevent imminent or life-threatening deterioration. Critical care was time spent personally by me on the following activities: development of treatment plan with patient and/or surrogate as well as nursing, discussions with consultants, evaluation of patient's response to treatment, examination of patient, obtaining history from patient or surrogate, ordering and performing treatments and interventions, ordering and review of laboratory studies, ordering and review of radiographic studies, pulse oximetry and re-evaluation of patient's condition.    (including critical care time)  Medical Decision Making / ED Course I have reviewed the nursing notes for this encounter and the patient's prior records (if available in EHR or on provided paperwork).   Brian Savage was evaluated in Emergency Department on 04/13/2020 for the symptoms described in the history of present illness. He was evaluated in the context of the global COVID-19 pandemic, which necessitated consideration that the patient might be at risk for infection with the SARS-CoV-2 virus that causes COVID-19. Institutional protocols and algorithms that pertain to the evaluation of patients at risk for  COVID-19 are in a state of rapid change based on information released by regulatory bodies including the CDC and federal and state organizations. These policies and algorithms were followed during the patient's care in the ED.  DOE. Tachy with mild hypoxia and soft BPs. EKG w/o acute changes.  Chest x-ray without evidence suggestive of pneumonia, pneumothorax, pneumomediastinum. No pulmonary edema. No abnormal contour of the mediastinum to suggest dissection. No evidence of acute injuries.  Would like to rule out PE with CTA.     This chart was dictated using voice recognition software.  Despite best efforts to proofread,  errors can occur  which can change the documentation meaning.   Nira Conn, MD 04/13/20 [phone]

## 2020-04-13 NOTE — Progress Notes (Signed)
Lower extremity venous bilateral study completed.   See Cv Proc for preliminary results.   Brian Savage  

## 2020-04-13 NOTE — ED Provider Notes (Signed)
Care of the patient was assumed at signout. Patient's vital signs remained notable for mild tachycardia, and I was notified of abnormal troponin result. Patient had no chest pain reported at that time.  9:56 AM In reviewing the patient's CT angiography study, patient has bilateral clots.   I received a call from our radiology colleagues and discussed findings with him as well.  Given his description of shortness of breath, weakness, presentation with diaphoresis, dyspnea, concern for pulmonary embolism, with elevated BNP, troponin, patient will require initiation of heparin.  Patient will require admission to our hospitalist colleagues for further monitoring, management. Patient aware of all findings, on cardiac monitor he remains in sinus tachycardia, though without supplemental oxygen he saturation about 93%.  Patient amenable to initiation of heparin, understands need for admission.  CRITICAL CARE Performed by: Gerhard Munch Total critical care time: 35 minutes Critical care time was exclusive of separately billable procedures and treating other patients. Critical care was necessary to treat or prevent imminent or life-threatening deterioration. Critical care was time spent personally by me on the following activities: development of treatment plan with patient and/or surrogate as well as nursing, discussions with consultants, evaluation of patient's response to treatment, examination of patient, obtaining history from patient or surrogate, ordering and performing treatments and interventions, ordering and review of laboratory studies, ordering and review of radiographic studies, pulse oximetry and re-evaluation of patient's condition.    Gerhard Munch, MD 04/13/20 1019

## 2020-04-13 NOTE — ED Notes (Signed)
CT called and states will get pt soon.

## 2020-04-13 NOTE — ED Triage Notes (Signed)
Pt came in [facility] from home c/o of [facility] and weakness. Pt experienced SHOB around 1500 yesterday when climbing the stairs and then again around 0200 this morning coming down them. Pt complains of feeling dizzy and weak. When EMS arrived pt was cool, clammy, diaphoretic. Pt did have a urinary cath placed on Friday d/t urinary retention. Pt is A/Ox4 and 92% on RA.

## 2020-04-13 NOTE — H&P (Signed)
History and Physical    Brian Savage AYT:[id number] DOB: [birth year] DOA: 04/13/2020  PCP: Brian Funk, MD Consultants:  Brian Savage - GI; Brian Savage - urology Patient coming from:  Home - lives with wife and daughter; Brian Savage: Wife, [phone]; [phone]  Chief Complaint: SOB  HPI: Brian Savage is a 69 y.o. male with medical history significant of DM presenting with SOB.  Symptoms started yesterday.  He became very winded doing simple things - walking up the steps and feeling like he was running a marathon.  Last Thursday night, he went to [facility] because he was having urinary retention (1200 cc).  He was catheterized and has had an indwelling foley since.  He called his urologist and was told the foley needs to stay until at least Thursday at his appointment.  His PSA has been increasing slowly over the years; in April he had a prostate biopsy and it was negative for malignancy.  It was mildly enlarged but not terribly so at that time.  At [facility], he did not have a UTI and so the thought was that it was related to BPH.  No weight loss.  Wife reports night sweats.  No LAD.  He is having nocturia.    No cough.  No CP.  No fever.   ED Course:  B PE, not sure why.  Prostate biopsy recently, negative for malignancy.  93-94% on RA.  Troponin 175.  Started on heparin.  Some R heart strain on CT.  Review of Systems: As per HPI; otherwise review of systems reviewed and negative.   Ambulatory Status:  Ambulates without assistance  COVID Vaccine Status:   Complete  Past Medical History:  Diagnosis Date  . BPH (benign prostatic hyperplasia)   . Diabetes mellitus without complication (HCC)   . Essential hypertension     Past Surgical History:  Procedure Laterality Date  . CHOLECYSTECTOMY    . ROTATOR CUFF REPAIR      Social History   Socioeconomic History  . Marital status: Married    Spouse name: Not on file  . Number of children: Not on file  . Years of education: Not on file  .  Highest education level: Not on file  Occupational History  . Occupation: Firefighter  Tobacco Use  . Smoking status: Never Smoker  . Smokeless tobacco: Never Used  Substance and Sexual Activity  . Alcohol use: Yes    Comment: occasional  . Drug use: Never  . Sexual activity: Not on file  Other Topics Concern  . Not on file  Social History Narrative  . Not on file   Social Determinants of Health   Financial Resource Strain:   . Difficulty of Paying Living Expenses:   Food Insecurity:   . Worried About Programme researcher, broadcasting/film/video in the Last Year:   . Barista in the Last Year:   Transportation Needs:   . Freight forwarder (Medical):   Brian Savage Kitchen Lack of Transportation (Non-Medical):   Physical Activity:   . Days of Exercise per Week:   . Minutes of Exercise per Session:   Stress:   . Feeling of Stress :   Social Connections:   . Frequency of Communication with Friends and Family:   . Frequency of Social Gatherings with Friends and Family:   . Attends Religious Services:   . Active Member of Clubs or Organizations:   . Attends Banker Meetings:   Brian Savage Kitchen Marital Status:   Intimate Partner Violence:   .  Fear of Current or Ex-Partner:   . Emotionally Abused:   Brian Savage Kitchen Physically Abused:   . Sexually Abused:     No Known Allergies  Family History  Problem Relation Age of Onset  . Stroke Mother   . Stroke Father   . Clotting disorder Neg Hx   . Cancer Neg Hx     Prior to Admission medications   Medication Sig Start Date End Date Taking? Authorizing Provider  amLODipine (NORVASC) 5 MG tablet Take 5 mg by mouth every morning. 04/14/15  Yes [provider]  aspirin 81 MG tablet Take 81 mg by mouth daily.   Yes [provider]  cholecalciferol (VITAMIN D3) 25 MCG (1000 UNIT) tablet Take 2,000 Units by mouth daily.   Yes [provider]  glimepiride (AMARYL) 4 MG tablet Take 2 mg by mouth in the morning and at bedtime.  04/14/15  Yes  [provider]  Magnesium 400 MG CAPS Take 400 mg by mouth daily.   Yes [provider]  metFORMIN (GLUCOPHAGE-XR) 500 MG 24 hr tablet Take 1,000 mg by mouth 2 (two) times daily. 02/24/20  Yes [provider]  Multiple Vitamin (MULTIVITAMIN) tablet Take 1 tablet by mouth daily.   Yes [provider]  telmisartan-hydrochlorothiazide (MICARDIS HCT) 80-25 MG per tablet Take 1 tablet by mouth daily.  04/14/15  Yes [provider]  zinc gluconate 50 MG tablet Take 50 mg by mouth daily.   Yes [provider]  ACCU-CHEK AVIVA PLUS test strip CHECK BLOOD SUGARS ALTERNATING BEFORE AND AFTER MEALS DAILY 04/14/15   [provider]  atovaquone-proguanil (MALARONE) 250-100 MG TABS Take 1 tablet by mouth daily. Start taking on July 6th until complete Patient not taking: Reported on 04/13/2020 05/01/15   Brian Munson, MD  ciprofloxacin (CIPRO) 500 MG tablet Take 1 tablet (500 mg total) by mouth 2 (two) times daily. If you have 3 loose stools + in 24hr. Can stop taking if diarrhea resolves Patient not taking: Reported on 04/13/2020 05/01/15   Brian Munson, MD  INVOKANA 100 MG TABS tablet 1 TABLET ONCE A DAY ORALLY 30 DAY(S) Patient not taking: Reported on 04/13/2020 04/14/15   [provider]    Physical Exam: Vitals:   04/13/20 1008 04/13/20 1108 04/13/20 1138 04/13/20 1420  BP:   104/74   Pulse: (!) 102 (!) 101 (!) 101 (!) 107  Resp: 18 17 19 19   Temp:      TempSrc:      SpO2: (!) 89% 92% 93% 93%  Weight:      Height:         . General:  Appears calm and comfortable and is NAD . Eyes:  PERRL, EOMI, normal lids, iris . ENT:  grossly normal hearing, lips & tongue, mmm; appropriate dentition . Neck:  no LAD, masses or thyromegaly . Cardiovascular:  RR with mild tachycardia, no m/r/g. No LE edema.  Respiratory:   CTA bilaterally with no wheezes/rales/rhonchi.  Normal respiratory effort. . Abdomen:  soft, NT, ND, NABS . Skin:  no  rash or induration seen on limited exam; no external evidence of LE DVT . Musculoskeletal:  grossly normal tone BUE/BLE, good ROM, no bony abnormality . Psychiatric:  grossly normal mood and affect, speech fluent and appropriate, AOx3 . Neurologic:  CN 2-12 grossly intact, moves all extremities in coordinated fashion    Radiological Exams on Admission: DG Chest 2 View  Result Date: 04/13/2020 CLINICAL DATA:  Shortness of breath EXAM:  CHEST - 2 VIEW COMPARISON:  11/03/2007 FINDINGS: Artifact from EKG leads. Normal heart size and mediastinal contours. No acute infiltrate or edema. No effusion or pneumothorax. No acute osseous findings. Degenerative endplate spurring with probable multilevel thoracic ankylosis. IMPRESSION: No active cardiopulmonary disease. Electronically Signed   By: Marnee Spring M.D.   On: 04/13/2020 07:13   CT Angio Chest PE W and/or Wo Contrast  Result Date: 04/13/2020 CLINICAL DATA:  Shortness of breath and weakness. Dizziness and weakness. EXAM: CT ANGIOGRAPHY CHEST WITH CONTRAST TECHNIQUE: Multidetector CT imaging of the chest was performed using the standard protocol during bolus administration of intravenous contrast. Multiplanar CT image reconstructions and MIPs were obtained to evaluate the vascular anatomy. CONTRAST:  43mL OMNIPAQUE IOHEXOL 350 MG/ML SOLN COMPARISON:  None. FINDINGS: Cardiovascular: There are low-attenuation filling defects within the pulmonary arteries, with the most proximal clot seen at the lobar level bilaterally. RV/LV ratio is 1.7. Atherosclerotic calcification of the aorta and coronary arteries. Pulmonic trunk is borderline enlarged. Heart is at the upper limits of normal in size to mildly enlarged. No pericardial effusion. Mediastinum/Nodes: No pathologically enlarged mediastinal, hilar or axillary lymph nodes. Esophagus is grossly unremarkable. Lungs/Pleura: Image quality is degraded by expiratory phase imaging. Mild perihilar ground-glass is seen  bilaterally, right greater than left. Mild dependent atelectasis. Trace right pleural effusion. Adherent debris in the trachea. Airway is otherwise unremarkable. Upper Abdomen: Visualized portions of the liver and adrenal glands are unremarkable. There may be punctate stones in the left kidney. Visualized portions of the spleen, pancreas, stomach and bowel are grossly unremarkable. Musculoskeletal: Degenerative changes in the spine. No worrisome lytic or sclerotic lesions. Flowing anterior osteophytosis in the thoracic spine. Review of the MIP images confirms the above findings. IMPRESSION: 1. Large volume of pulmonary emboli in the lungs bilaterally, with dilatation of the right atrium and right ventricle (RV to LV ratio of 1.7) indicative of elevated right-sided heart pressures and right heart strain. These findings have been shown to be associated with a increased morbidity and mortality in the setting pulmonary embolism. Critical Value/emergent results were called by telephone at the time of interpretation on 04/13/2020 at 10:07 am to provider LOFFLA , who verbally acknowledged these results. 2. Mild perihilar ground-glass, right greater than left, may be due to infarction but given the central location, bronchopneumonia is also considered. 3. Trace right pleural effusion. 4. Punctate left renal stones. 5. Aortic atherosclerosis (ICD10-I70.0). Coronary artery calcification. Electronically Signed   By: Leanna Battles M.D.   On: 04/13/2020 10:07    EKG: Independently reviewed.  Sinus tachycardia with rate 105; no evidence of acute ischemia   Labs on Admission: I have personally reviewed the available labs and imaging studies at the time of the admission.  Pertinent labs:   Glucose 254 BUN 24/Creatinine 1.44/GFR 50 BNP 426.3 HS troponin 175 WBC 13.6    Assessment/Plan Principal Problem:   Bilateral pulmonary embolism (HCC) Active Problems:   Essential hypertension   Diabetes mellitus without  complication (HCC)   BPH (benign prostatic hyperplasia)   Acute urinary retention   Obesity (BMI 30.0-34.9)   PE -Patient without prior episodes of thromboembolic disease presenting with new B PE -Was seen in the ER last weekend for urinary retention and had foley placed -He does not have any obvious risk factors - is generally healthy and active without recent injury or surgery; has had several prolonged car trips but stops frequently for bathroom breaks; no known malignancy (see below) -He was initially on RA  but is currently on 2L [place] O2 -Will observe on telemetry -Initiate anticoagulation - for now, will start treatment-dose heparin drip -He does have evidence of right heart strain on his CT; will check Echo although there is not an obvious indication for thrombectomy or thrombolysis at this time -He is likely able to transition to alternative [facility] agent tomorrow -We discussed risks/benefits of Coumadin vs. DOAC therapy and he is likely to prefer DOAC treatment despite the cost -LE [place] was ordered to further evaluate, although his legs did not show obvious evidence of clot -Hold ASA while on AC -Elevated HS troponin has negative delta, likely associated with heart strain  DM -Will check A1c -hold Glucophage, Amaryl -Cover with moderate-scale SSI  HTN -Continue Norvasc, Micardis HCT  BPH with recent urinary retention -Patient with rising PSA so biopsies performed in April -Developed acute urinary retention last week, had foley placed on 6/10 -Has indwelling foley until urology f/u on Thursday (6/17) -While this is likely associated with BPH, an agressive prostate CA could also present in this manner -Urology f/u as scheduled for now without further intervention, leave foley in place  Obesity Body mass index is 31.14 kg/m. -Weight loss should be encouraged -Outpatient PCP/bariatric medicine f/u encouraged    Note: This patient has been tested and is negative for the novel  coronavirus COVID-19.  DVT prophylaxis:  Heparin Code Status:  Full - confirmed with patient/family Family Communication: Wife was present throughout evaluation. Disposition Plan:  The patient is from: home  Anticipated d/c is to: home without [facility] services  Anticipated d/c date will depend on clinical response to treatment, but possibly as early as tomorrow if he has excellent response to treatment  Patient is currently: acutely ill Consults called: None Admission status:  It is my clinical opinion that referral for OBSERVATION is reasonable and necessary in this patient based on the above information provided. The aforementioned taken together are felt to place the patient at high risk for further clinical deterioration. However it is anticipated that the patient may be medically stable for discharge from the hospital within 24 to 48 hours.    Karmen Bongo MD [facility]   How to contact the [facility] Attending or Consulting provider [place] or covering provider during after hours [place], for this patient?  1. Check the care team in [facility] and look for a) attending/consulting [facility] provider listed and b) the [facility] team listed 2. Log into [email or web address] and use [place]'s universal password to access. If you do not have the password, please contact the hospital operator. 3. Locate the [facility] provider you are looking for under [facility] and page to a number that you can be directly reached. 4. If you still have difficulty reaching the provider, please page the [facility] (Director on Call) for the Hospitalists listed on amion for assistance.   04/13/2020, 3:56 PM

## 2020-04-13 NOTE — ED Notes (Addendum)
Pt SpO2 is 90-92% on room air. Per Dr. Ophelia Charter, goal is 92%+. 2L [place] placed for comfort to promote rest. Pt currently 94%. RN to reassess need for supplemental O2 after period of rest.

## 2020-04-13 NOTE — Progress Notes (Signed)
ANTICOAGULATION CONSULT NOTE - Follow Up Consult  Pharmacy Consult for Heparin Indication: chest pain/ACS and pulmonary embolus  No Known Allergies  Patient Measurements: Height: 6\' 2"  (188 cm) Weight: 110 kg (242 lb 8.1 oz) IBW/kg (Calculated) : 82.2 Heparin Dosing Weight: 104 kg  Vital Signs: BP: 119/85 (06/14 1810) Pulse Rate: 106 (06/14 1815)  Labs: Recent Labs    04/13/20 0619 04/13/20 0920 04/13/20 1812  HGB 13.5  --   --   HCT 40.9  --   --   PLT 230  --   --   HEPARINUNFRC  --   --  <0.10*  CREATININE 1.44*  --   --   TROPONINIHS 175* 174*  --     Estimated Creatinine Clearance: 64.8 mL/min (A) (by C-G formula based on SCr of 1.44 mg/dL (H)).   Medical History: Past Medical History:  Diagnosis Date  . BPH (benign prostatic hyperplasia)   . Diabetes mellitus without complication (HCC)   . Essential hypertension     Assessment: 69 yr old man presented with SOB/weakness, elevated troponin. CT scan was positive for bilateral PE with right heart strain. Dopplers negative for DVT in low extremities. He was not on anticoagulation PTA. H/H, platelets WNL.  Heparin level ~8 hrs after heparin 4000 units IV bolus X 1, followed by heparin infusion at 1300 units/hr, was <0.10 units/ml. Per RN, no issues with IV or bleeding observed.  Goal of Therapy:  Heparin level 0.3-0.7 units/ml Monitor platelets by anticoagulation protocol: Yes   Plan:  Heparin 5000 units IV bolus X 1, and increase heparin infusion to 1700 units/hr Check 6-hr heparin level Monitor daily heparin level, CBC Monitor for signs/symptoms of bleeding  73, PharmD, BCPS, [facility] Clinical Pharmacist 04/13/2020 7:27 PM

## 2020-04-14 ENCOUNTER — Observation Stay (HOSPITAL_BASED_OUTPATIENT_CLINIC_OR_DEPARTMENT_OTHER): Payer: Medicare Other

## 2020-04-14 DIAGNOSIS — I2602 Saddle embolus of pulmonary artery with acute cor pulmonale: Secondary | ICD-10-CM

## 2020-04-14 DIAGNOSIS — I2699 Other pulmonary embolism without acute cor pulmonale: Secondary | ICD-10-CM | POA: Diagnosis not present

## 2020-04-14 DIAGNOSIS — I5040 Unspecified combined systolic (congestive) and diastolic (congestive) heart failure: Secondary | ICD-10-CM | POA: Diagnosis present

## 2020-04-14 LAB — TROPONIN I (HIGH SENSITIVITY): Troponin I (High Sensitivity): 69 ng/L — ABNORMAL HIGH (ref <18)

## 2020-04-14 LAB — BASIC METABOLIC PANEL
Anion gap: 10 (ref 5–15)
BUN: 18 mg/dL (ref 8–23)
CO2: 24 mmol/L (ref 22–32)
Calcium: 8.9 mg/dL (ref 8.9–10.3)
Chloride: 104 mmol/L (ref 98–111)
Creatinine, Ser: 1.07 mg/dL (ref 0.61–1.24)
GFR calc Af Amer: >60 mL/min (ref >60)
GFR calc non Af Amer: >60 mL/min (ref >60)
Glucose, Bld: 190 mg/dL — ABNORMAL HIGH (ref 70–99)
Potassium: 3.8 mmol/L (ref 3.5–5.1)
Sodium: 138 mmol/L (ref 135–145)

## 2020-04-14 LAB — HEPARIN LEVEL (UNFRACTIONATED)
Heparin Unfractionated: 0.59 IU/mL (ref 0.30–0.70)
Heparin Unfractionated: 0.56 IU/mL (ref 0.30–0.70)

## 2020-04-14 LAB — ECHOCARDIOGRAM LIMITED
Height: 74 in
Weight: 3880.1 oz

## 2020-04-14 LAB — GLUCOSE, CAPILLARY
Glucose-Capillary: 171 mg/dL — ABNORMAL HIGH (ref 70–99)
Glucose-Capillary: 161 mg/dL — ABNORMAL HIGH (ref 70–99)
Glucose-Capillary: 160 mg/dL — ABNORMAL HIGH (ref 70–99)

## 2020-04-14 MED ORDER — PERFLUTREN LIPID MICROSPHERE
1.0000 mL | INTRAVENOUS | Status: AC | PRN
  Administered 2020-04-14: 2 mL via INTRAVENOUS
  Filled 2020-04-14: qty 10

## 2020-04-14 MED ORDER — METOPROLOL SUCCINATE ER 50 MG PO TB24: 50.0000 mg | ORAL_TABLET | Freq: Every day | ORAL | 0 refills | Status: AC

## 2020-04-14 MED ORDER — METOPROLOL SUCCINATE ER 50 MG PO TB24: 50.0000 mg | ORAL_TABLET | Freq: Every day | ORAL | Status: DC

## 2020-04-14 MED ORDER — APIXABAN 5 MG PO TABS
10.0000 mg | ORAL_TABLET | Freq: Once | ORAL | Status: AC
  Administered 2020-04-14: 10 mg via ORAL
  Filled 2020-04-14: qty 2

## 2020-04-14 MED ORDER — APIXABAN (ELIQUIS) VTE STARTER PACK (10MG AND 5MG)
ORAL_TABLET | ORAL | 0 refills | Status: DC
Start: 2020-04-14 — End: 2021-01-29

## 2020-04-14 MED FILL — METOPROLOL SUCCINATE ER 50: 50 | 30 days supply | Qty: 30 | Fill #0

## 2020-04-14 MED FILL — ELIQUIS STARTER PACK 5 MG T: 5 | 30 days supply | Qty: 74 | Fill #0

## 2020-04-14 NOTE — Progress Notes (Signed)
ANTICOAGULATION CONSULT NOTE - Follow Up Consult  Pharmacy Consult for Heparin Indication: chest pain/ACS and pulmonary embolus  Assessment: 69 yr old man presented with SOB/weakness, elevated troponin. CT scan was positive for bilateral PE with right heart strain. Dopplers negative for DVT in low extremities. He was not on anticoagulation PTA. H/H, platelets WNL.  Heparin level now 0.59 units/ml  Goal of Therapy:  Heparin level 0.3-0.7 units/ml Monitor platelets by anticoagulation protocol: Yes   Plan:  Continue heparin infusion at 1700 units/hr Check 6-hr heparin level to confirm Monitor daily heparin level, CBC Monitor for signs/symptoms of bleeding  Thanks for allowing pharmacy to be a part of this patient's care.  Talbert Cage, PharmD Clinical Pharmacist  04/14/2020 2:39 AM

## 2020-04-14 NOTE — TOC Initial Note (Signed)
Transition of Care [facility]) - Initial/Assessment Note    Patient Details  Name: Brian Savage MRN: [id number] Date of Birth: [birth year]  [facility]) CM/SW Contact:    Marilu Favre, RN Phone Number: 04/14/2020, 11:55 AM  Clinical Narrative:                 Spoke to patient and wife at bedside and daughter on speaker phone.   Medication/Dose: ELIQUIS  5 MG BID  Covered?: Yes  Tier: 2 Drug  Prescription Coverage Preferred Pharmacy: [facility] [place] with Person/Company/Phone Number:: MARLOES  @ [employer] # [phone]  Co-Pay: $33.00  Prior Approval: No  Deductible: Unmet (OUT-OF-POCKET: UNMET)  TOC pharmacy will bring first 30 days of Eliquis to patient's room prior to discharge for free.   Expected Discharge Plan: Home/Self Care Barriers to Discharge: Continued Medical Work up   Patient Goals and CMS Choice Patient states their goals for this hospitalization and ongoing recovery are:: to return home CMS Medicare.gov Compare Post Acute Care list provided to:: Patient Choice offered to / list presented to : NA  Expected Discharge Plan and Services Expected Discharge Plan: Home/Self Care   Discharge Planning Services: CM Consult, Medication Assistance   Living arrangements for the past 2 months: Single Family Home                 DME Arranged: N/A         HH Arranged: NA          Prior Living Arrangements/Services Living arrangements for the past 2 months: Single Family Home Lives with:: Spouse Patient language and need for interpreter reviewed:: Yes Do you feel safe going back to the place where you live?: Yes      Need for Family Participation in Patient Care: Yes (Comment) Care giver support system in place?: Yes (comment)   Criminal Activity/Legal Involvement Pertinent to Current Situation/Hospitalization: No - Comment as needed  Activities of Daily Living Home Assistive Devices/Equipment: None ADL Screening (condition  at time of admission) Patient's cognitive ability adequate to safely complete daily activities?: Yes Is the patient deaf or have difficulty hearing?: No Does the patient have difficulty seeing, even when wearing glasses/contacts?: No Does the patient have difficulty concentrating, remembering, or making decisions?: No Patient able to express need for assistance with ADLs?: Yes Does the patient have difficulty dressing or bathing?: No Independently performs ADLs?: Yes (appropriate for developmental age) Does the patient have difficulty walking or climbing stairs?: No Weakness of Legs: None Weakness of Arms/Hands: None  Permission Sought/Granted                  Emotional Assessment Appearance:: Appears stated age Attitude/Demeanor/Rapport: Engaged Affect (typically observed): Accepting Orientation: : Oriented to Place, Oriented to Self, Oriented to  Time, Oriented to Situation Alcohol / Substance Use: Not Applicable Psych Involvement: No (comment)  Admission diagnosis:  Bilateral pulmonary embolism (HCC) [I26.99] Acute pulmonary embolism with acute cor pulmonale, unspecified pulmonary embolism type ([place]) [I26.09] Patient Active Problem List   Diagnosis Date Noted  . Bilateral pulmonary embolism ([place]) 04/13/2020  . Acute urinary retention 04/13/2020  . Obesity (BMI 30.0-34.9) 04/13/2020  . Essential hypertension   . Diabetes mellitus without complication ([place])   . BPH (benign prostatic hyperplasia)    PCP:  Lavone Orn, MD Pharmacy:   [facility] [place]), [place] - [address] [place] [address] Phone: [phone] Fax: [phone]  [facility] - [place], [place] - [address] [place] [address] Phone: [phone] Fax: [phone]     Social Determinants of Health (SDOH) Interventions    Readmission Risk Interventions No flowsheet data  found.

## 2020-04-14 NOTE — Progress Notes (Addendum)
ANTICOAGULATION CONSULT NOTE - Follow Up Consult  Pharmacy Consult for Heparin Indication: chest pain/ACS and pulmonary embolus  Assessment: 69 yr old man presented with SOB/weakness, elevated troponin. CT scan was positive for bilateral PE with right heart strain. Dopplers negative for DVT in low extremities. He was not on anticoagulation PTA. H/H, platelets WNL.  Heparin level therapeutic x 2  Goal of Therapy:  Heparin level 0.3-0.7 units/ml Monitor platelets by anticoagulation protocol: Yes   Plan:  Continue heparin infusion at 1700 units/hr Monitor daily heparin level, CBC Monitor for signs/symptoms of bleeding  Thank you Okey Regal, PharmD Clinical Pharmacist  04/14/2020 9:43 AM   PM: Switching to Eliquis -- VTE starter pack provided by [facility] pharmacy, education complete  Thank you Okey Regal, PharmD

## 2020-04-14 NOTE — Discharge Summary (Addendum)
Physician Discharge Summary  Nthony Lefferts ZOX:[id number] DOB: November 17, 1950 DOA: 04/13/2020  PCP: Kirby Funk, MD  Admit date: 04/13/2020 Discharge date: 04/14/2020  Admitted From: home Discharge disposition: home   Recommendations for Outpatient Follow-Up:   1. Follow up with PCP 1-2 weeks for evaluation of respiratory status 2. Follow up with cardiology   Discharge Diagnosis:   Principal Problem:   Bilateral pulmonary embolism (HCC) Active Problems:   Essential hypertension   Combined congestive systolic and diastolic heart failure (HCC)   Diabetes mellitus without complication (HCC)   BPH (benign prostatic hyperplasia)   Acute urinary retention   Obesity (BMI 30.0-34.9)    Discharge Condition: Improved.  Diet recommendation: Low sodium, heart healthy.  Carbohydrate-modified.  Regular.  Wound care: None.  Code status: Full.   History of Present Illness:   Brian Savage is a 69 y.o. male with medical history significant of DM presented 04/13/20 with SOB.  Symptoms started 1 day prior.  He became very winded doing simple things - walking up the steps and feeling like he was running a marathon.  5 days prior he went to [facility] because he was having urinary retention (1200 cc).  He was catheterized and has had an indwelling foley since.  He called his urologist and was told the foley needs to stay until at least Thursday at his appointment.  His PSA has been increasing slowly over the years; in April he had a prostate biopsy and it was negative for malignancy.  It was mildly enlarged but not terribly so at that time.  At [facility], he did not have a UTI and so the thought was that it was related to BPH.  No weight loss.  Wife reports night sweats.  No LAD.  He is having nocturia.    No cough.  No CP.  No fever.   Hospital Course by Problem:   PE. Bilateral. Patient without prior episodes of thromboembolic disease presented with new B PE. He does not have any obvious  risk factors - is generally healthy and active without recent injury or surgery; has had several prolonged car trips but stops frequently for bathroom breaks; no known malignancy. He was initially on room air was placed on  on 2L [place] O2. No events on tele. Heparin drip started. No evidence of right heart strain on his CT; Echo with EF 40% with mildly decreased left ventricle function global hypokinesis and grade 1 DD, moderately reduced systolic function. LE [place] was neg for DVT. Elevated HS troponin has negative delta, likely associated with heart strain. Will discharge with eliquis and switch amlodipine to Toprol-XL per cardiology recommendation.   DM uncontrolled. HgA1c 7.6. home meds include Glucophage, Amaryl.  HTN fair control. Home meds include  Norvasc, Micardis HCT  BPH with recent urinary retention -Patient with rising PSA so biopsies performed in April. Developed acute urinary retention last week, had foley placed on 6/10. Has indwelling foley until urology f/u on Thursday (6/17).  Combined CHF. Echo as noted above. Evaluated by cardiology who opined that his low ejection fraction is at least secondary to acute PE and recommended the patient continue his anticoagulation for at least 1 year and repeat his echo in 6 months.  They recommended switching him from amlodipine to Toprol-XL and continuing Eliquis and discharging home.  Obesity Body mass index is 31.14 kg/m. -Weight loss should be encouraged -Outpatient PCP/bariatric medicine f/u encouraged     Medical Consultants:  cardiology    Discharge  Exam:   Vitals:   04/14/20 0530 04/14/20 1318  BP: 114/79 130/86  Pulse: 97 98  Resp: 18 18  Temp: 98.6 F (37 C) 98.4 F (36.9 C)  SpO2: 94% 97%   Vitals:   04/13/20 1938 04/14/20 0044 04/14/20 0530 04/14/20 1318  BP:  117/74 114/79 130/86  Pulse:  99 97 98  Resp: 18 18 18 18   Temp:  99 F (37.2 C) 98.6 F (37 C) 98.4 F (36.9 C)  TempSrc:  Oral Oral Oral  SpO2:   94% 94% 97%  Weight:      Height:        General exam: Appears calm and comfortable. Ambulating in room with steady gait Respiratory system: Clear to auscultation. Respiratory effort normal. Cardiovascular system: S1 & S2 heard, RRR. No JVD,  rubs, gallops or clicks. No murmurs. Gastrointestinal system: Abdomen is nondistended, soft and nontender. No organomegaly or masses felt. Normal bowel sounds heard. Central nervous system: Alert and oriented. No focal neurological deficits. Extremities: No clubbing,  or cyanosis. No edema. Skin: No rashes, lesions or ulcers. Psychiatry: Judgement and insight appear normal. Mood & affect appropriate.    The results of significant diagnostics from this hospitalization (including imaging, microbiology, ancillary and laboratory) are listed below for reference.     Procedures and Diagnostic Studies:   DG Chest 2 View  Result Date: 04/13/2020 CLINICAL DATA:  Shortness of breath EXAM: CHEST - 2 VIEW COMPARISON:  11/03/2007 FINDINGS: Artifact from EKG leads. Normal heart size and mediastinal contours. No acute infiltrate or edema. No effusion or pneumothorax. No acute osseous findings. Degenerative endplate spurring with probable multilevel thoracic ankylosis. IMPRESSION: No active cardiopulmonary disease. Electronically Signed   By: 01/01/2008 M.D.   On: 04/13/2020 07:13   CT Angio Chest PE W and/or Wo Contrast  Result Date: 04/13/2020 CLINICAL DATA:  Shortness of breath and weakness. Dizziness and weakness. EXAM: CT ANGIOGRAPHY CHEST WITH CONTRAST TECHNIQUE: Multidetector CT imaging of the chest was performed using the standard protocol during bolus administration of intravenous contrast. Multiplanar CT image reconstructions and MIPs were obtained to evaluate the vascular anatomy. CONTRAST:  13mL OMNIPAQUE IOHEXOL 350 MG/ML SOLN COMPARISON:  None. FINDINGS: Cardiovascular: There are low-attenuation filling defects within the pulmonary arteries, with  the most proximal clot seen at the lobar level bilaterally. RV/LV ratio is 1.7. Atherosclerotic calcification of the aorta and coronary arteries. Pulmonic trunk is borderline enlarged. Heart is at the upper limits of normal in size to mildly enlarged. No pericardial effusion. Mediastinum/Nodes: No pathologically enlarged mediastinal, hilar or axillary lymph nodes. Esophagus is grossly unremarkable. Lungs/Pleura: Image quality is degraded by expiratory phase imaging. Mild perihilar ground-glass is seen bilaterally, right greater than left. Mild dependent atelectasis. Trace right pleural effusion. Adherent debris in the trachea. Airway is otherwise unremarkable. Upper Abdomen: Visualized portions of the liver and adrenal glands are unremarkable. There may be punctate stones in the left kidney. Visualized portions of the spleen, pancreas, stomach and bowel are grossly unremarkable. Musculoskeletal: Degenerative changes in the spine. No worrisome lytic or sclerotic lesions. Flowing anterior osteophytosis in the thoracic spine. Review of the MIP images confirms the above findings. IMPRESSION: 1. Large volume of pulmonary emboli in the lungs bilaterally, with dilatation of the right atrium and right ventricle (RV to LV ratio of 1.7) indicative of elevated right-sided heart pressures and right heart strain. These findings have been shown to be associated with a increased morbidity and mortality in the setting pulmonary embolism. Critical Value/emergent results  were called by telephone at the time of interpretation on 04/13/2020 at 10:07 am to provider LOFFLA , who verbally acknowledged these results. 2. Mild perihilar ground-glass, right greater than left, may be due to infarction but given the central location, bronchopneumonia is also considered. 3. Trace right pleural effusion. 4. Punctate left renal stones. 5. Aortic atherosclerosis (ICD10-I70.0). Coronary artery calcification. Electronically Signed   By: Lorin Picket M.D.   On: 04/13/2020 10:07   VAS [place] LOWER EXTREMITY VENOUS (DVT)  Result Date: 04/13/2020  Lower Venous DVTStudy Indications: Pulmonary embolism.  Comparison Study: No prior studies. Performing Technologist: Darlin Coco  Examination Guidelines: A complete evaluation includes B-mode imaging, spectral Doppler, color Doppler, and power Doppler as needed of all accessible portions of each vessel. Bilateral testing is considered an integral part of a complete examination. Limited examinations for reoccurring indications may be performed as noted. The reflux portion of the exam is performed with the patient in reverse Trendelenburg.  +---------+---------------+---------+-----------+----------+--------------+ RIGHT    CompressibilityPhasicitySpontaneityPropertiesThrombus Aging +---------+---------------+---------+-----------+----------+--------------+ CFV      Full           Yes      Yes                                 +---------+---------------+---------+-----------+----------+--------------+ SFJ      Full                                                        +---------+---------------+---------+-----------+----------+--------------+ FV Prox  Full                                                        +---------+---------------+---------+-----------+----------+--------------+ FV Mid   Full                                                        +---------+---------------+---------+-----------+----------+--------------+ FV DistalFull                                                        +---------+---------------+---------+-----------+----------+--------------+ PFV      Full                                                        +---------+---------------+---------+-----------+----------+--------------+ POP      Full           Yes      Yes                                 +---------+---------------+---------+-----------+----------+--------------+ PTV  Full                                                        +---------+---------------+---------+-----------+----------+--------------+ PERO     Full                                                        +---------+---------------+---------+-----------+----------+--------------+   +---------+---------------+---------+-----------+----------+--------------+ LEFT     CompressibilityPhasicitySpontaneityPropertiesThrombus Aging +---------+---------------+---------+-----------+----------+--------------+ CFV      Full           Yes      Yes                                 +---------+---------------+---------+-----------+----------+--------------+ SFJ      Full                                                        +---------+---------------+---------+-----------+----------+--------------+ FV Prox  Full                                                        +---------+---------------+---------+-----------+----------+--------------+ FV Mid   Full                                                        +---------+---------------+---------+-----------+----------+--------------+ FV DistalFull                                                        +---------+---------------+---------+-----------+----------+--------------+ PFV      Full                                                        +---------+---------------+---------+-----------+----------+--------------+ POP      Full           Yes      Yes                                 +---------+---------------+---------+-----------+----------+--------------+ PTV      Full                                                        +---------+---------------+---------+-----------+----------+--------------+  PERO     Full                                                        +---------+---------------+---------+-----------+----------+--------------+     Summary: BILATERAL: - No evidence of deep vein  thrombosis seen in the lower extremities, bilaterally. -No evidence of popliteal cyst, bilaterally.   *See table(s) above for measurements and observations. Electronically signed by Gretta Began MD on 04/13/2020 at 5:31:00 PM.    Final    ECHOCARDIOGRAM LIMITED  Result Date: 04/14/2020    ECHOCARDIOGRAM LIMITED REPORT   Patient Name:   NIKITA KOPE Date of Exam: 04/14/2020 Medical Rec #:  [id number]        Height:       74.0 in Accession #:    [id number]       Weight:       242.5 lb Date of Birth:  [birth year]       BSA:          2.359 m Patient Age:    68 years         BP:           114/79 mmHg Patient Gender: M                HR:           91 bpm. Exam Location:  Inpatient Procedure: Limited Echo, Cardiac Doppler, Color Doppler and Intracardiac            Opacification Agent Indications:    Pulmonary Embolus 415.19 / I26.99  History:        Patient has no prior history of Echocardiogram examinations.                 Risk Factors:Hypertension and Non-Smoker. PE.  Sonographer:    Renella Cunas RDCS Referring Phys: [id number] JENNIFER YATES IMPRESSIONS  1. Left ventricular ejection fraction, by estimation, is 40 to 45%. The left ventricle has mildly decreased function. The left ventricle demonstrates global hypokinesis. Left ventricular diastolic parameters are consistent with Grade I diastolic dysfunction (impaired relaxation). There is the interventricular septum is flattened in systole, consistent with right ventricular pressure overload.  2. Right ventricular systolic function is moderately reduced. The right ventricular size is moderately enlarged. Tricuspid regurgitation signal is inadequate for assessing PA pressure.  3. The mitral valve is normal in structure. No evidence of mitral valve regurgitation.  4. The aortic valve is normal in structure. Aortic valve regurgitation is not visualized. No aortic stenosis is present.  5. The inferior vena cava is dilated in size with <50% respiratory variability, suggesting  right atrial pressure of 15 mmHg. FINDINGS  Left Ventricle: Left ventricular ejection fraction, by estimation, is 40 to 45%. The left ventricle has mildly decreased function. The left ventricle demonstrates global hypokinesis. Definity contrast agent was given IV to delineate the left ventricular  endocardial borders. The left ventricular internal cavity size was normal in size. The interventricular septum is flattened in systole, consistent with right ventricular pressure overload. Normal left ventricular filling pressure. Right Ventricle: The right ventricular size is moderately enlarged. No increase in right ventricular wall thickness. Right ventricular systolic function is moderately reduced. Tricuspid regurgitation signal is inadequate for assessing PA pressure. Left Atrium: Left atrial size was normal in size. Right Atrium: Right atrial size  was not well visualized. Mitral Valve: The mitral valve is normal in structure. Tricuspid Valve: The tricuspid valve is not well visualized. Tricuspid valve regurgitation is not demonstrated. Aortic Valve: The aortic valve is normal in structure. Aortic valve regurgitation is not visualized. No aortic stenosis is present. Pulmonic Valve: The pulmonic valve was not well visualized. Pulmonic valve regurgitation is not visualized. Aorta: The aortic root is normal in size and structure. Venous: The inferior vena cava is dilated in size with less than 50% respiratory variability, suggesting right atrial pressure of 15 mmHg. IAS/Shunts: No atrial level shunt detected by color flow Doppler.  LEFT VENTRICLE PLAX 2D LVIDd:         4.80 cm      Diastology LVIDs:         4.10 cm      LV e' lateral:   7.83 cm/s LV PW:         0.70 cm      LV E/e' lateral: 6.4 LV IVS:        0.70 cm      LV e' medial:    6.42 cm/s LVOT diam:     2.00 cm      LV E/e' medial:  7.8 LV SV:         49 LV SV Index:   21 LVOT Area:     3.14 cm  LV Volumes (MOD) LV vol d, MOD A2C: 95.9 ml LV vol d, MOD A4C:  104.0 ml LV vol s, MOD A2C: 59.1 ml LV vol s, MOD A4C: 58.7 ml LV SV MOD A2C:     36.8 ml LV SV MOD A4C:     104.0 ml LV SV MOD BP:      45.1 ml RIGHT VENTRICLE RV S prime:     7.18 cm/s TAPSE (M-mode): 1.2 cm LEFT ATRIUM             Index       RIGHT ATRIUM           Index LA diam:        2.80 cm 1.19 cm/m  RA Area:     15.20 cm LA Vol (A2C):   35.6 ml 15.09 ml/m RA Volume:   43.30 ml  18.36 ml/m LA Vol (A4C):   24.7 ml 10.47 ml/m LA Biplane Vol: 31.6 ml 13.40 ml/m  AORTIC VALVE LVOT Vmax:   90.00 cm/s LVOT Vmean:  65.600 cm/s LVOT VTI:    0.155 m  AORTA Ao Root diam: 3.30 cm MITRAL VALVE MV Area (PHT): 7.02 cm    SHUNTS MV Decel Time: 108 msec    Systemic VTI:  0.16 m MV E velocity: 50.30 cm/s  Systemic Diam: 2.00 cm MV A velocity: 88.30 cm/s MV E/A ratio:  0.57 Mihai Croitoru MD Electronically signed by Thurmon Fair MD Signature Date/Time: 04/14/2020/10:19:23 AM    Final      Labs:   Basic Metabolic Panel: Recent Labs  Lab 04/13/20 0619 04/14/20 0701  NA 134* 138  K 4.2 3.8  CL 98 104  CO2 23 24  GLUCOSE 254* 190*  BUN 24* 18  CREATININE 1.44* 1.07  CALCIUM 9.1 8.9   GFR Estimated Creatinine Clearance: 87.2 mL/min (by C-G formula based on SCr of 1.07 mg/dL). Liver Function Tests: Recent Labs  Lab 04/13/20 0619  AST 26  ALT 28  ALKPHOS 61  BILITOT 0.9  PROT 7.3  ALBUMIN 3.5   No results for input(s): LIPASE, AMYLASE in  the last 168 hours. No results for input(s): AMMONIA in the last 168 hours. Coagulation profile No results for input(s): INR, PROTIME in the last 168 hours.  CBC: Recent Labs  Lab 04/13/20 0619  WBC 13.6*  HGB 13.5  HCT 40.9  MCV 92.7  PLT 230   Cardiac Enzymes: No results for input(s): CKTOTAL, CKMB, CKMBINDEX, TROPONINI in the last 168 hours. BNP: Invalid input(s): POCBNP CBG: Recent Labs  Lab 04/13/20 1805 04/13/20 2152 04/14/20 0627 04/14/20 1108  GLUCAP 272* 173* 171* 161*   D-Dimer No results for input(s): DDIMER in the last  72 hours. Hgb A1c Recent Labs    04/13/20 1812  HGBA1C 7.6*   Lipid Profile No results for input(s): CHOL, HDL, LDLCALC, TRIG, CHOLHDL, LDLDIRECT in the last 72 hours. Thyroid function studies No results for input(s): TSH, T4TOTAL, T3FREE, THYROIDAB in the last 72 hours.  Invalid input(s): FREET3 Anemia work up No results for input(s): VITAMINB12, FOLATE, FERRITIN, TIBC, IRON, RETICCTPCT in the last 72 hours. Microbiology Recent Results (from the past 240 hour(s))  Urine culture     Status: None   Collection Time: 04/09/20 11:05 PM   Specimen: Urine, Random  Result Value Ref Range Status   Specimen Description   Final    URINE, RANDOM Performed at [facility], [address] [place], [place] [address]    Special Requests   Final    NONE Performed at [facility], [address] [place], [place] [address]    Culture   Final    NO GROWTH Performed at [facility], [address], [place], [place] [address]    Report Status 04/11/2020 FINAL  Final  SARS Coronavirus 2 by RT PCR (hospital order, performed in [facility] hospital lab) Nasopharyngeal Nasopharyngeal Swab     Status: None   Collection Time: 04/13/20 10:37 AM   Specimen: Nasopharyngeal Swab  Result Value Ref Range Status   SARS Coronavirus 2 NEGATIVE NEGATIVE Final    Comment: (NOTE) SARS-CoV-2 target nucleic acids are NOT DETECTED.  The SARS-CoV-2 RNA is generally detectable in upper and lower respiratory specimens during the acute phase of infection. The lowest concentration of SARS-CoV-2 viral copies this assay can detect is 250 copies / mL. A negative result does not preclude SARS-CoV-2 infection and should not be used as the sole basis for treatment or other patient management decisions.  A negative result may occur with improper specimen collection / handling, submission of specimen other than nasopharyngeal swab, presence of viral mutation(s) within the areas targeted by  this assay, and inadequate number of viral copies (<250 copies / mL). A negative result must be combined with clinical observations, patient history, and epidemiological information.  Fact Sheet for Patients:   BoilerBrush.com.cy  Fact Sheet for Healthcare Providers: https://pope.com/  This test is not yet approved or  cleared by the [place] FDA and has been authorized for detection and/or diagnosis of SARS-CoV-2 by FDA under an Emergency Use Authorization (EUA).  This EUA will remain in effect (meaning this test can be used) for the duration of the COVID-19 declaration under Section 564(b)(1) of the Act, 21 U.S.C. section 360bbb-3(b)(1), unless the authorization is terminated or revoked sooner.  Performed at [facility], [address] [place], [place] [address]      Discharge Instructions:    Allergies as of 04/14/2020   No Known Allergies     Medication List    STOP taking  these medications   aspirin 81 MG tablet     TAKE these medications   Accu-Chek Aviva Plus test strip Generic drug: glucose blood CHECK BLOOD SUGARS ALTERNATING BEFORE AND AFTER MEALS DAILY   amLODipine 5 MG tablet Commonly known as: NORVASC Take 5 mg by mouth every morning.   Apixaban Starter Pack (  and ) Commonly known as: ELIQUIS STARTER PACK Take as directed on package: start with two-5mg  tablets twice daily for 7 days. On day 8, switch to one-5mg  tablet twice daily.   cholecalciferol 25 MCG (1000 UNIT) tablet Commonly known as: VITAMIN D3 Take 2,000 Units by mouth daily.   glimepiride 4 MG tablet Commonly known as: AMARYL Take 2 mg by mouth in the morning and at bedtime.   Magnesium 400 MG Caps Take 400 mg by mouth daily.   metFORMIN 500 MG 24 hr tablet Commonly known as: GLUCOPHAGE-XR Take 1,000 mg by mouth 2 (two) times daily.   multivitamin tablet Take 1 tablet by mouth daily.     telmisartan-hydrochlorothiazide 80-25 MG tablet Commonly known as: MICARDIS HCT Take 1 tablet by mouth daily.   zinc gluconate 50 MG tablet Take 50 mg by mouth daily.         Time coordinating discharge: 40 minutes  Signed:  Gwenyth Bender NP  [facility] 04/14/2020, 2:35 PM

## 2020-04-14 NOTE — Care Management (Addendum)
Entered benefit check for eliquis,  5 po bid for 30 days.  Changed pharmacy to [facility] , which will provide first 30 days free.    Thanks   [employer]

## 2020-04-14 NOTE — Progress Notes (Signed)
Discharge teaching complete. Meds, diet, activity, follow up appointments reviewed and all questions answered. Copy of instructions given to patient and meds sent to bedside by [facility] pharmacy .Patient discharged home via wheelchair with wife.

## 2020-04-14 NOTE — TOC Benefit Eligibility Note (Signed)
Transition of Care [facility]) Benefit Eligibility Note    Patient Details  Name: Brian Savage MRN: [id number] Date of Birth: 11/24/1950   Medication/Dose: Everlene Balls  5 MG BID  Covered?: Yes  Tier: 2 Drug  Prescription Coverage Preferred Pharmacy: [facility] with Person/Company/Phone Number:: MARLOES  @ [employer] # [phone]  Co-Pay: $33.00  Prior Approval: No  Deductible: Unmet (OUT-OF-POCKET: Secundino Ginger Phone Number: 04/14/2020, 11:37 AM
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
## 2020-04-14 NOTE — Progress Notes (Signed)
  Echocardiogram 2D Echocardiogram has been performed.  Stark Bray Swaim 04/14/2020, 10:10 AM

## 2020-04-20 ENCOUNTER — Emergency Department (HOSPITAL_COMMUNITY)
Admission: EM | Admit: 2020-04-20 | Discharge: 2020-04-20 | Disposition: A | Discharge status: Home / Self Care | Payer: Medicare Other | Attending: Emergency Medicine | Admitting: Emergency Medicine

## 2020-04-20 ENCOUNTER — Encounter (HOSPITAL_COMMUNITY): Payer: Self-pay

## 2020-04-20 DIAGNOSIS — Z7984 Long term (current) use of oral hypoglycemic drugs: Secondary | ICD-10-CM | POA: Diagnosis not present

## 2020-04-20 DIAGNOSIS — Z7901 Long term (current) use of anticoagulants: Secondary | ICD-10-CM

## 2020-04-20 DIAGNOSIS — R339 Retention of urine, unspecified: Secondary | ICD-10-CM | POA: Insufficient documentation

## 2020-04-20 DIAGNOSIS — E119 Type 2 diabetes mellitus without complications: Secondary | ICD-10-CM | POA: Diagnosis not present

## 2020-04-20 DIAGNOSIS — I5042 Chronic combined systolic (congestive) and diastolic (congestive) heart failure: Secondary | ICD-10-CM | POA: Insufficient documentation

## 2020-04-20 DIAGNOSIS — I110 Hypertensive heart disease with heart failure: Secondary | ICD-10-CM | POA: Diagnosis not present

## 2020-04-20 DIAGNOSIS — I10 Essential (primary) hypertension: Secondary | ICD-10-CM

## 2020-04-20 DIAGNOSIS — Z79899 Other long term (current) drug therapy: Secondary | ICD-10-CM | POA: Diagnosis not present

## 2020-04-20 DIAGNOSIS — R338 Other retention of urine: Secondary | ICD-10-CM

## 2020-04-20 NOTE — ED Provider Notes (Signed)
[facility] EMERGENCY DEPARTMENT Provider Note   CSN: [id number] Arrival date & time: 04/20/20  0036   History Chief Complaint  Patient presents with  . Urinary Retention    Brian Savage is a 69 y.o. male.  The history is provided by the patient.  He has history of hypertension, diabetes, pulmonary embolism anticoagulated on apixaban, combined systolic and diastolic heart failure and comes in because of urinary retention.  He had a Foley catheter in place for a week and it was removed 4 days ago.  He was doing well until approximately 24 hours ago when he started having inability to urinate.  He denies fever or chills.  Past Medical History:  Diagnosis Date  . BPH (benign prostatic hyperplasia)   . Diabetes mellitus without complication (HCC)   . Essential hypertension     Patient Active Problem List   Diagnosis Date Noted  . Combined congestive systolic and diastolic heart failure (HCC) 04/14/2020  . Bilateral pulmonary embolism (HCC) 04/13/2020  . Acute urinary retention 04/13/2020  . Obesity (BMI 30.0-34.9) 04/13/2020  . Essential hypertension   . Diabetes mellitus without complication (HCC)   . BPH (benign prostatic hyperplasia)     Past Surgical History:  Procedure Laterality Date  . CHOLECYSTECTOMY    . ROTATOR CUFF REPAIR         Family History  Problem Relation Age of Onset  . Stroke Mother   . Stroke Father   . Clotting disorder Neg Hx   . Cancer Neg Hx     Social History   Tobacco Use  . Smoking status: Never Smoker  . Smokeless tobacco: Never Used  Substance Use Topics  . Alcohol use: Yes    Comment: occasional  . Drug use: Never    Home Medications Prior to Admission medications   Medication Sig Start Date End Date Taking? Authorizing Provider  ACCU-CHEK AVIVA PLUS test strip CHECK BLOOD SUGARS ALTERNATING BEFORE AND AFTER MEALS DAILY 04/14/15   [provider]  APIXABAN Everlene Balls) VTE STARTER PACK  (10MG  AND 5MG ) Take as directed on package: start with two-5mg  tablets twice daily for 7 days. On day 8, switch to one-5mg  tablet twice daily. 04/14/20   Black, , NP  cholecalciferol (VITAMIN D3) 25 MCG (1000 UNIT) tablet Take 2,000 Units by mouth daily.    [provider]  glimepiride (AMARYL) 4 MG tablet Take 2 mg by mouth in the morning and at bedtime.  04/14/15   [provider]  Magnesium 400 MG CAPS Take 400 mg by mouth daily.    [provider]  metFORMIN (GLUCOPHAGE-XR) 500 MG 24 hr tablet Take 1,000 mg by mouth 2 (two) times daily. 02/24/20   [provider]  metoprolol succinate (TOPROL-XL) 50 MG 24 hr tablet Take 1 tablet (50 mg total) by mouth daily. Take with or immediately following a meal. 04/14/20 05/14/20  04/16/20, MD  Multiple Vitamin (MULTIVITAMIN) tablet Take 1 tablet by mouth daily.    [provider]  telmisartan-hydrochlorothiazide (MICARDIS HCT) 80-25 MG per tablet Take 1 tablet by mouth daily.  04/14/15   [provider]  zinc gluconate 50 MG tablet Take 50 mg by mouth daily.    [provider]    Allergies    Patient has no known allergies.  Review of Systems   Review of Systems  All other systems reviewed and are negative.   Physical Exam Updated Vital Signs BP (!) 154/87 (BP Location: Right  Arm)   Pulse 91   Temp 98.7 F (37.1 C) (Oral)   Resp 18   Ht 6\' 2"  (1.88 m)   Wt 109.8 kg   SpO2 98%   BMI 31.07 kg/m   Physical Exam Vitals and nursing note reviewed.   69 year old male, resting comfortably and in no acute distress. Vital signs are significant for elevated blood pressure. Oxygen saturation is 98%, which is normal. Head is normocephalic and atraumatic. PERRLA, EOMI. Oropharynx is clear. Neck is nontender and supple without adenopathy or JVD. Back is nontender and there is no CVA tenderness. Lungs are clear without rales, wheezes, or rhonchi. Chest is nontender. Heart has  regular rate and rhythm without murmur. Abdomen is soft, flat, nontender without masses or hepatosplenomegaly and peristalsis is normoactive.  Bladder is significantly distended. Extremities have no cyanosis or edema, full range of motion is present. Skin is warm and dry without rash. Neurologic: Mental status is normal, cranial nerves are intact, there are no motor or sensory deficits.  ED Results / Procedures / Treatments   Labs (all labs ordered are listed, but only abnormal results are displayed) Labs Reviewed  URINE CULTURE  URINALYSIS, ROUTINE W REFLEX MICROSCOPIC   Procedures Procedures  Medications Ordered in ED Medications - No data to display  ED Course  I have reviewed the triage vital signs and the nursing notes.  Pertinent lab results that were available during my care of the patient were reviewed by me and considered in my medical decision making (see chart for details).  MDM Rules/Calculators/A&P Acute urinary retention.  Foley catheter is placed draining clear urine.  Old records are reviewed showing prior ED visit for urinary retention on 6/10 followed by hospitalization for pulmonary embolism on 6/14.  He is discharged with instructions to follow-up with his urologist.  Final Clinical Impression(s) / ED Diagnoses Final diagnoses:  Acute urinary retention    Rx / DC Orders ED Discharge Orders    None       Delora Fuel, MD 34/28/76 [phone]

## 2020-04-20 NOTE — ED Triage Notes (Signed)
Pt states that he is unable to urinate, has not voided early Sunday morning, had cath removed last Thursday.

## 2020-04-20 NOTE — Discharge Instructions (Signed)
See your urologist as soon as possible.

## 2020-04-29 DIAGNOSIS — R338 Other retention of urine: Secondary | ICD-10-CM | POA: Diagnosis not present

## 2020-05-13 DIAGNOSIS — R338 Other retention of urine: Secondary | ICD-10-CM | POA: Diagnosis not present

## 2020-05-13 DIAGNOSIS — N401 Enlarged prostate with lower urinary tract symptoms: Secondary | ICD-10-CM | POA: Diagnosis not present

## 2020-05-13 DIAGNOSIS — R972 Elevated prostate specific antigen [PSA]: Secondary | ICD-10-CM | POA: Diagnosis not present

## 2020-05-22 ENCOUNTER — Telehealth: Payer: Self-pay | Admitting: Cardiology

## 2020-05-22 NOTE — Telephone Encounter (Signed)
Patient's wife calling to find out what activities the patient is okay to resume.

## 2020-05-22 NOTE — Telephone Encounter (Signed)
I spoke with patient and his wife. They are asking about what activities he can resume. I told them Dr Anne Fu note indicated he should gently increase his walking.  I asked them to contact primary care regarding other activity questions.

## 2020-06-17 DIAGNOSIS — R972 Elevated prostate specific antigen [PSA]: Secondary | ICD-10-CM | POA: Diagnosis not present

## 2020-06-17 DIAGNOSIS — N401 Enlarged prostate with lower urinary tract symptoms: Secondary | ICD-10-CM | POA: Diagnosis not present

## 2020-06-17 DIAGNOSIS — N3000 Acute cystitis without hematuria: Secondary | ICD-10-CM | POA: Diagnosis not present

## 2020-06-17 DIAGNOSIS — R338 Other retention of urine: Secondary | ICD-10-CM | POA: Diagnosis not present

## 2020-06-25 DIAGNOSIS — I2510 Atherosclerotic heart disease of native coronary artery without angina pectoris: Secondary | ICD-10-CM | POA: Diagnosis not present

## 2020-06-25 DIAGNOSIS — R3914 Feeling of incomplete bladder emptying: Secondary | ICD-10-CM | POA: Diagnosis not present

## 2020-06-25 DIAGNOSIS — E785 Hyperlipidemia, unspecified: Secondary | ICD-10-CM | POA: Diagnosis not present

## 2020-06-25 DIAGNOSIS — E1169 Type 2 diabetes mellitus with other specified complication: Secondary | ICD-10-CM | POA: Diagnosis not present

## 2020-06-25 DIAGNOSIS — I10 Essential (primary) hypertension: Secondary | ICD-10-CM | POA: Diagnosis not present

## 2020-06-25 DIAGNOSIS — M7989 Other specified soft tissue disorders: Secondary | ICD-10-CM | POA: Diagnosis not present

## 2020-06-25 DIAGNOSIS — N401 Enlarged prostate with lower urinary tract symptoms: Secondary | ICD-10-CM | POA: Diagnosis not present

## 2020-07-01 ENCOUNTER — Encounter: Payer: Self-pay | Admitting: Cardiology

## 2020-07-01 ENCOUNTER — Ambulatory Visit (INDEPENDENT_AMBULATORY_CARE_PROVIDER_SITE_OTHER): Payer: Medicare Other | Admitting: Cardiology

## 2020-07-01 ENCOUNTER — Other Ambulatory Visit: Payer: Self-pay

## 2020-07-01 VITALS — BP 130/70 | HR 80 | Ht 74.0 in | Wt 226.0 lb

## 2020-07-01 DIAGNOSIS — I428 Other cardiomyopathies: Secondary | ICD-10-CM

## 2020-07-01 DIAGNOSIS — I10 Essential (primary) hypertension: Secondary | ICD-10-CM | POA: Diagnosis not present

## 2020-07-01 DIAGNOSIS — I2699 Other pulmonary embolism without acute cor pulmonale: Secondary | ICD-10-CM | POA: Diagnosis not present

## 2020-07-01 NOTE — Progress Notes (Signed)
Cardiology Office Note:    Date:  07/01/2020   ID:  Brian Savage, DOB [birth year], MRN [id number]  PCP:  Kirby Funk, MD  [facility] Cardiologist:  No primary care provider on file.  [facility] Electrophysiologist:  None   Referring MD: Kirby Funk, MD     History of Present Illness:    Brian Savage is a 69 y.o. male here for the follow-up of biventricular failure.  He was seen in the hospital on 04/14/2020.  Had shortness of breath with minimal activity.  Was diagnosed with bilateral pulmonary embolism with right-sided heart strain.  Coronary calcification and aortic atherosclerosis noted.  Started on Eliquis.  No DVT  Echo was performed showed EF of 40 to 45% and right ventricle was moderately enlarged as well.  Moderately reduced.  Overall is feeling much better.  Shortness of breath is much improved.  No orthopnea no PND no syncope.  He did have a episode of what was apparently gout on his foot.  No bleeding.  Feels well.  Retired Arts development officer.   Past Medical History:  Diagnosis Date  . BPH (benign prostatic hyperplasia)   . Diabetes mellitus without complication (HCC)   . Essential hypertension     Past Surgical History:  Procedure Laterality Date  . CHOLECYSTECTOMY    . ROTATOR CUFF REPAIR      Current Medications: Current Meds  Medication Sig  . ACCU-CHEK AVIVA PLUS test strip CHECK BLOOD SUGARS ALTERNATING BEFORE AND AFTER MEALS DAILY  . APIXABAN (ELIQUIS) VTE STARTER PACK (10MG  AND 5MG ) Take as directed on package: start with two-5mg  tablets twice daily for 7 days. On day 8, switch to one-5mg  tablet twice daily. (Patient taking differently: Take 5 mg by mouth 2 (two) times daily. Take as directed on package: start with two-5mg  tablets twice daily for 7 days. On day 8, switch to one-5mg  tablet twice daily.)  . cholecalciferol (VITAMIN D3) 25 MCG (1000 UNIT) tablet Take 2,000 Units by mouth daily.  glimepiride (AMARYL) 4 MG tablet Take 2  mg by mouth in the morning and at bedtime.   . Magnesium 400 MG CAPS Take 400 mg by mouth daily.  . metFORMIN (GLUCOPHAGE-XR) 500 MG 24 hr tablet Take 1,000 mg by mouth 2 (two) times daily.  . metoprolol succinate (TOPROL-XL) 50 MG 24 hr tablet Take 1 tablet (50 mg total) by mouth daily. Take with or immediately following a meal.  . Multiple Vitamin (MULTIVITAMIN) tablet Take 1 tablet by mouth daily.  telmisartan-hydrochlorothiazide (MICARDIS HCT) 80-25 MG per tablet Take 1 tablet by mouth daily.   Marland [employer] zinc gluconate 50 MG tablet Take 50 mg by mouth daily.     Allergies:   Patient has no known allergies.   Social History   Socioeconomic History  . Marital status: Married    Spouse name: Not on file  . Number of children: Not on file  . Years of education: Not on file  . Highest education level: Not on file  Occupational History  . Occupation: [employer]  Tobacco Use  . Smoking status: Never Smoker  . Smokeless tobacco: Never Used  Substance and Sexual Activity  . Alcohol use: Yes    Comment: occasional  . Drug use: Never  . Sexual activity: Not on file  Other Topics Concern  . Not on file  Social History Narrative  . Not on file   Social Determinants of Health   Financial Resource Strain:   . Difficulty of  Paying Living Expenses: Not on file  Food Insecurity:   . Worried About Programme researcher, broadcasting/film/video in the Last Year: Not on file  . Ran Out of Food in the Last Year: Not on file  Transportation Needs:   . Lack of Transportation (Medical): Not on file  . Lack of Transportation (Non-Medical): Not on file  Physical Activity:   . Days of Exercise per Week: Not on file  . Minutes of Exercise per Session: Not on file  Stress:   . Feeling of Stress : Not on file  Social Connections:   . Frequency of Communication with Friends and Family: Not on file  . Frequency of Social Gatherings with Friends and Family: Not on file  . Attends Religious Services: Not on file  .  Active Member of Clubs or Organizations: Not on file  . Attends Banker Meetings: Not on file  . Marital Status: Not on file     Family History: The patient's family history includes Stroke in his father and mother. There is no history of Clotting disorder or Cancer.  ROS:   Please see the history of present illness.     All other systems reviewed and are negative.  EKGs/Labs/Other Studies Reviewed:    The following studies were reviewed today: Hospital records reviewed.  Prior troponin peaked at 175  EKG:  prior EKG showed sinus tachycardia rate of 105 with poor R wave progression.  Recent Labs: 04/13/2020: ALT 28; B Natriuretic Peptide 426.3; Hemoglobin 13.5; Platelets 230 04/14/2020: BUN 18; Creatinine, Ser 1.07; Potassium 3.8; Sodium 138  Recent Lipid Panel No results found for: CHOL, TRIG, HDL, CHOLHDL, VLDL, LDLCALC, LDLDIRECT  Physical Exam:    VS:  BP 130/70   Pulse 80   Ht 6\' 2"  (1.88 m)   Wt 226 lb (102.5 kg)   SpO2 95%   BMI 29.02 kg/m     Wt Readings from Last 3 Encounters:  07/01/20 226 lb (102.5 kg)  04/20/20 242 lb (109.8 kg)  04/13/20 242 lb 8.1 oz (110 kg)     GEN:  Well nourished, well developed in no acute distress HEENT: Normal NECK: No JVD; No carotid bruits LYMPHATICS: No lymphadenopathy CARDIAC: RRR, no murmurs, rubs, gallops RESPIRATORY:  Clear to auscultation without rales, wheezing or rhonchi  ABDOMEN: Soft, non-tender, non-distended MUSCULOSKELETAL:  No edema; No deformity  SKIN: Warm and dry NEUROLOGIC:  Alert and oriented x 3 PSYCHIATRIC:  Normal affect   ASSESSMENT:    1. Nonischemic cardiomyopathy (HCC)   2. Bilateral pulmonary embolism (HCC)   3. Essential hypertension    PLAN:    In order of problems listed above:  Reduced ejection fraction, EF 40 to 45% with right ventricular dysfunction as well -This is in the setting of bilateral large volume pulmonary emboli.-On losartan and beta-blocker.  Hopefully over  time, this will improve. -We will go ahead and check echocardiogram again to see how his function is.  Clinically, he is feeling better.  Essential hypertension -Currently stable.  We stopped the amlodipine and started the Toprol instead.  Pulmonary embolism unprovoked with right heart strain -On Eliquis.  I think we should go ahead and continue this.  Diabetes with hypertension -Dr. 04/15/20 has been watching.  Hemoglobin A1c 7.6.  Coronary calcification -Continue with risk factor modification.  Urinary retention/BPH -Is on Uroxatral.  Had an indwelling catheter previously.  Overall doing well currently.  At some point may require TURP surgery.  I would like for him  to at least complete 6 months of therapy with Eliquis before having to hold for a TURP.  Medication Adjustments/Labs and Tests Ordered: Current medicines are reviewed at length with the patient today.  Concerns regarding medicines are outlined above.  Orders Placed This Encounter  Procedures  . ECHOCARDIOGRAM COMPLETE   No orders of the defined types were placed in this encounter.   Patient Instructions  Medication Instructions:  The current medical regimen is effective;  continue present plan and medications.  *If you need a refill on your cardiac medications before your next appointment, please call your pharmacy*  Testing/Procedures: Your physician has requested that you have an echocardiogram. Echocardiography is a painless test that uses sound waves to create images of your heart. It provides your doctor with information about the size and shape of your heart and how well your heart's chambers and valves are working. This procedure takes approximately one hour. There are no restrictions for this procedure.  Follow-Up: At [facility], you and your health needs are our priority.  As part of our continuing mission to provide you with exceptional heart care, we have created designated Provider Care Teams.  These  Care Teams include your primary Cardiologist (physician) and Advanced Practice Providers (APPs -  Physician Assistants and Nurse Practitioners) who all work together to provide you with the care you need, when you need it.  We recommend signing up for the patient portal called "MyChart".  Sign up information is provided on this After Visit Summary.  MyChart is used to connect with patients for Virtual Visits (Telemedicine).  Patients are able to view lab/test results, encounter notes, upcoming appointments, etc.  Non-urgent messages can be sent to your provider as well.   To learn more about what you can do with MyChart, go to [email or web address].    Your next appointment:   6 month(s)  The format for your next appointment:   In Person  Provider:   Donato Schultz, MD   Thank you for choosing [facility]!!         Signed, Donato Schultz, MD  07/01/2020 2:19 PM    [facility]

## 2020-07-01 NOTE — Patient Instructions (Signed)
Medication Instructions:  °The current medical regimen is effective;  continue present plan and medications. ° °*If you need a refill on your cardiac medications before your next appointment, please call your pharmacy* ° °Testing/Procedures: °Your physician has requested that you have an echocardiogram. Echocardiography is a painless test that uses sound waves to create images of your heart. It provides your doctor with information about the size and shape of your heart and how well your heart’s chambers and valves are working. This procedure takes approximately one hour. There are no restrictions for this procedure. ° °Follow-Up: °At [facility], you and your health needs are our priority.  As part of our continuing mission to provide you with exceptional heart care, we have created designated Provider Care Teams.  These Care Teams include your primary Cardiologist (physician) and Advanced Practice Providers (APPs -  Physician Assistants and Nurse Practitioners) who all work together to provide you with the care you need, when you need it. ° °We recommend signing up for the patient portal called "MyChart".  Sign up information is provided on this After Visit Summary.  MyChart is used to connect with patients for Virtual Visits (Telemedicine).  Patients are able to view lab/test results, encounter notes, upcoming appointments, etc.  Non-urgent messages can be sent to your provider as well.   °To learn more about what you can do with MyChart, go to [email or web address].   ° °Your next appointment:   °6 month(s) ° °The format for your next appointment:   °In Person ° °Provider:   °Mark Skains, MD { ° ° ° °Thank you for choosing [facility]!! ° ° ° °

## 2020-07-17 ENCOUNTER — Ambulatory Visit (HOSPITAL_COMMUNITY): Payer: Medicare Other | Attending: Cardiology

## 2020-07-17 ENCOUNTER — Other Ambulatory Visit: Payer: Self-pay

## 2020-07-17 DIAGNOSIS — I428 Other cardiomyopathies: Secondary | ICD-10-CM | POA: Insufficient documentation

## 2020-07-17 LAB — ECHOCARDIOGRAM COMPLETE
Area-P 1/2: 3.17 cm2
S' Lateral: 2.9 cm

## 2020-07-30 DIAGNOSIS — Z23 Encounter for immunization: Secondary | ICD-10-CM | POA: Diagnosis not present

## 2020-08-11 DIAGNOSIS — H2513 Age-related nuclear cataract, bilateral: Secondary | ICD-10-CM | POA: Diagnosis not present

## 2020-08-11 DIAGNOSIS — H524 Presbyopia: Secondary | ICD-10-CM | POA: Diagnosis not present

## 2020-08-11 DIAGNOSIS — E119 Type 2 diabetes mellitus without complications: Secondary | ICD-10-CM | POA: Diagnosis not present

## 2020-08-11 DIAGNOSIS — H52203 Unspecified astigmatism, bilateral: Secondary | ICD-10-CM | POA: Diagnosis not present

## 2020-10-09 DIAGNOSIS — R972 Elevated prostate specific antigen [PSA]: Secondary | ICD-10-CM | POA: Diagnosis not present

## 2020-10-13 DIAGNOSIS — E785 Hyperlipidemia, unspecified: Secondary | ICD-10-CM | POA: Diagnosis not present

## 2020-10-13 DIAGNOSIS — Z1389 Encounter for screening for other disorder: Secondary | ICD-10-CM | POA: Diagnosis not present

## 2020-10-13 DIAGNOSIS — I700 Atherosclerosis of aorta: Secondary | ICD-10-CM | POA: Diagnosis not present

## 2020-10-13 DIAGNOSIS — Z86711 Personal history of pulmonary embolism: Secondary | ICD-10-CM | POA: Diagnosis not present

## 2020-10-13 DIAGNOSIS — N401 Enlarged prostate with lower urinary tract symptoms: Secondary | ICD-10-CM | POA: Diagnosis not present

## 2020-10-13 DIAGNOSIS — I2510 Atherosclerotic heart disease of native coronary artery without angina pectoris: Secondary | ICD-10-CM | POA: Diagnosis not present

## 2020-10-13 DIAGNOSIS — Z0000 Encounter for general adult medical examination without abnormal findings: Secondary | ICD-10-CM | POA: Diagnosis not present

## 2020-10-13 DIAGNOSIS — I10 Essential (primary) hypertension: Secondary | ICD-10-CM | POA: Diagnosis not present

## 2020-10-13 DIAGNOSIS — E1169 Type 2 diabetes mellitus with other specified complication: Secondary | ICD-10-CM | POA: Diagnosis not present

## 2020-10-16 DIAGNOSIS — N401 Enlarged prostate with lower urinary tract symptoms: Secondary | ICD-10-CM | POA: Diagnosis not present

## 2020-10-16 DIAGNOSIS — R351 Nocturia: Secondary | ICD-10-CM | POA: Diagnosis not present

## 2020-10-16 DIAGNOSIS — N3000 Acute cystitis without hematuria: Secondary | ICD-10-CM | POA: Diagnosis not present

## 2020-10-16 DIAGNOSIS — R972 Elevated prostate specific antigen [PSA]: Secondary | ICD-10-CM | POA: Diagnosis not present

## 2020-10-16 DIAGNOSIS — R8271 Bacteriuria: Secondary | ICD-10-CM | POA: Diagnosis not present

## 2020-11-09 ENCOUNTER — Other Ambulatory Visit: Payer: Medicare Other

## 2020-11-09 ENCOUNTER — Other Ambulatory Visit: Payer: Self-pay

## 2020-11-09 DIAGNOSIS — Z20822 Contact with and (suspected) exposure to covid-19: Secondary | ICD-10-CM | POA: Diagnosis not present

## 2020-11-12 LAB — NOVEL CORONAVIRUS, NAA: SARS-CoV-2, NAA: NOT DETECTED

## 2020-12-29 ENCOUNTER — Ambulatory Visit: Payer: BC Managed Care – PPO | Admitting: Cardiology

## 2021-01-01 ENCOUNTER — Other Ambulatory Visit: Payer: Self-pay

## 2021-01-01 ENCOUNTER — Ambulatory Visit (INDEPENDENT_AMBULATORY_CARE_PROVIDER_SITE_OTHER): Payer: Medicare Other | Admitting: Cardiology

## 2021-01-01 ENCOUNTER — Encounter: Payer: Self-pay | Admitting: *Deleted

## 2021-01-01 ENCOUNTER — Encounter: Payer: Self-pay | Admitting: Cardiology

## 2021-01-01 VITALS — BP 134/70 | HR 79 | Ht 74.0 in | Wt 232.0 lb

## 2021-01-01 DIAGNOSIS — I10 Essential (primary) hypertension: Secondary | ICD-10-CM

## 2021-01-01 DIAGNOSIS — R0600 Dyspnea, unspecified: Secondary | ICD-10-CM

## 2021-01-01 DIAGNOSIS — E785 Hyperlipidemia, unspecified: Secondary | ICD-10-CM | POA: Diagnosis not present

## 2021-01-01 DIAGNOSIS — I428 Other cardiomyopathies: Secondary | ICD-10-CM

## 2021-01-01 DIAGNOSIS — E119 Type 2 diabetes mellitus without complications: Secondary | ICD-10-CM

## 2021-01-01 DIAGNOSIS — I2699 Other pulmonary embolism without acute cor pulmonale: Secondary | ICD-10-CM | POA: Diagnosis not present

## 2021-01-01 MED ORDER — ROSUVASTATIN CALCIUM 20 MG PO TABS
20.0000 mg | ORAL_TABLET | Freq: Every day | ORAL | 3 refills | Status: DC
Start: 2021-01-01 — End: 2021-01-01

## 2021-01-01 MED ORDER — ROSUVASTATIN CALCIUM 20 MG PO TABS: 20.0000 mg | ORAL_TABLET | Freq: Every day | ORAL | 3 refills | Status: DC

## 2021-01-01 NOTE — Progress Notes (Signed)
Cardiology Office Note:    Date:  01/01/2021   ID:  Brian Savage, Brian Savage 08-10-1951, MRN [id number]  PCP:  Kirby Funk, MD   [facility]  Cardiologist:  Donato Schultz, MD  Advanced Practice Provider:  No care team member to display Electrophysiologist:  None       Referring MD: Kirby Funk, MD     History of Present Illness:    Brian Savage is a 69 y.o. male here for the follow-up of coronary artery disease, coronary calcification, aortic atherosclerosis, biventricular failure.  Previously seen in hospital in 04/14/2020 with shortness of breath with minimal activity.  He was diagnosed with bilateral pulmonary embolism at the time with right heart strain.  Echocardiogram at the time of PE showed 40 to 45% EF with moderately low enlarged right ventricle with moderately reduced systolic function.  Thankfully, repeat echocardiogram on 07/17/2020 showed normalization of his ventricular function.  Shortness of breath has improved.  Overall doing quite well no chest pain no fevers chills nausea vomiting syncope bleeding.  He is a retired Arts development officer.  We went over his coronary artery calcification/disease in his LAD and focal lesion in his circumflex.  We are checking a stress test.  Past Medical History:  Diagnosis Date  . BPH (benign prostatic hyperplasia)   . Diabetes mellitus without complication (HCC)   . Essential hypertension     Past Surgical History:  Procedure Laterality Date  . CHOLECYSTECTOMY    . ROTATOR CUFF REPAIR      Current Medications: Current Meds  Medication Sig  . ACCU-CHEK AVIVA PLUS test strip CHECK BLOOD SUGARS ALTERNATING BEFORE AND AFTER MEALS DAILY  . APIXABAN (ELIQUIS) VTE STARTER PACK (10MG  AND 5MG ) Take as directed on package: start with two-5mg  tablets twice daily for 7 days. On day 8, switch to one-5mg  tablet twice daily. (Patient taking differently: Take 5 mg by mouth 2 (two) times daily. Take as directed on  package: start with two-5mg  tablets twice daily for 7 days. On day 8, switch to one-5mg  tablet twice daily.)  . cholecalciferol (VITAMIN D3) 25 MCG (1000 UNIT) tablet Take 2,000 Units by mouth daily.  glimepiride (AMARYL) 4 MG tablet Take 2 mg by mouth in the morning and at bedtime.   . Magnesium 400 MG CAPS Take 400 mg by mouth daily.  . metFORMIN (GLUCOPHAGE-XR) 500 MG 24 hr tablet Take 1,000 mg by mouth 2 (two) times daily.  . metoprolol succinate (TOPROL-XL) 50 MG 24 hr tablet Take 1 tablet (50 mg total) by mouth daily. Take with or immediately following a meal.  . Multiple Vitamin (MULTIVITAMIN) tablet Take 1 tablet by mouth daily.  telmisartan-hydrochlorothiazide (MICARDIS HCT) 80-25 MG per tablet Take 1 tablet by mouth daily.   Marland Kitchen zinc gluconate 50 MG tablet Take 50 mg by mouth daily.  . [DISCONTINUED] rosuvastatin (CRESTOR) 20 MG tablet Take 1 tablet (20 mg total) by mouth daily.  . [DISCONTINUED] rosuvastatin (CRESTOR) 5 MG tablet Take 5 mg by mouth daily.     Allergies:   Patient has no known allergies.   Social History   Socioeconomic History  . Marital status: Married    Spouse name: Not on file  . Number of children: Not on file  . Years of education: Not on file  . Highest education level: Not on file  Occupational History  . Occupation: [employer]  Tobacco Use  . Smoking status: Never Smoker  . Smokeless tobacco: Never Used  Substance and Sexual Activity  . Alcohol use: Yes    Comment: occasional  . Drug use: Never  . Sexual activity: Not on file  Other Topics Concern  . Not on file  Social History Narrative  . Not on file   Social Determinants of Health   Financial Resource Strain: Not on file  Food Insecurity: Not on file  Transportation Needs: Not on file  Physical Activity: Not on file  Stress: Not on file  Social Connections: Not on file     Family History: The patient's family history includes Stroke in his father and mother. There  is no history of Clotting disorder or Cancer.  ROS:   Please see the history of present illness.     All other systems reviewed and are negative.  EKGs/Labs/Other Studies Reviewed:    The following studies were reviewed today:  ECHO 01/01/21: 1. Left ventricular ejection fraction, by estimation, is 60 to 65%. The  left ventricle has normal function. The left ventricle has no regional  wall motion abnormalities. Left ventricular diastolic parameters are  consistent with Grade I diastolic  dysfunction (impaired relaxation).  2. Right ventricular systolic function is normal. The right ventricular  size is normal.  3. The mitral valve is normal in structure. Trivial mitral valve  regurgitation. No evidence of mitral stenosis.  4. The aortic valve is normal in structure. Aortic valve regurgitation is  not visualized. No aortic stenosis is present.  5. The inferior vena cava is normal in size with greater than 50%  respiratory variability, suggesting right atrial pressure of 3 mmHg.    Recent Labs: 04/13/2020: ALT 28; B Natriuretic Peptide 426.3; Hemoglobin 13.5; Platelets 230 04/14/2020: BUN 18; Creatinine, Ser 1.07; Potassium 3.8; Sodium 138  Recent Lipid Panel No results found for: CHOL, TRIG, HDL, CHOLHDL, VLDL, LDLCALC, LDLDIRECT    Physical Exam:    VS:  BP 134/70 (BP Location: Left Arm, Patient Position: Sitting, Cuff Size: Normal)   Pulse 79   Ht 6\' 2"  (1.88 m)   Wt 232 lb (105.2 kg)   SpO2 96%   BMI 29.79 kg/m     Wt Readings from Last 3 Encounters:  01/01/21 232 lb (105.2 kg)  07/01/20 226 lb (102.5 kg)  04/20/20 242 lb (109.8 kg)     GEN:  Well nourished, well developed in no acute distress HEENT: Normal NECK: No JVD; No carotid bruits LYMPHATICS: No lymphadenopathy CARDIAC: RRR, no murmurs, rubs, gallops RESPIRATORY:  Clear to auscultation without rales, wheezing or rhonchi  ABDOMEN: Soft, non-tender, non-distended MUSCULOSKELETAL:  No edema; No  deformity  SKIN: Warm and dry NEUROLOGIC:  Alert and oriented x 3 PSYCHIATRIC:  Normal affect   ASSESSMENT:    1. Nonischemic cardiomyopathy (HCC)   2. Bilateral pulmonary embolism (HCC)   3. Essential hypertension   4. Diabetes mellitus with coincident hypertension (HCC)   5. Hyperlipidemia, unspecified hyperlipidemia type   6. Dyspnea, unspecified type    PLAN:    In order of problems listed above:  Prior reduced ejection fraction 40 to 45% with right ventricular dysfunction as well -This has normalized.  This was in the setting of large volume PE. -Has been on losartan and beta-blocker.  I think make sense to continue this as long as there is no issue. -Excellent resolution of EF.  Pulmonary embolism unprovoked with right ventricular heart strain -On Eliquis.  We will continue this because of the severity of his PE.  Essential hypertension -Stable.  Instead of  amlodipine in the past, we started Toprol.  Diabetes with hypertension -Dr. Valentina Lucks has been watching closely.  Prior hemoglobin A1c 7.6.  Coronary artery calcification/coronary artery disease, dyspnea -Continue with risk factor modification, we will modify his Crestor to 20 mg a day from 5.  Repeat lipid panel in 3 months with ALT in 3 months. -I will also check a Lexiscan stress test to make sure that there is no high risk evidence of ischemia present. -Personally showed him images of his CT scan.  LDL 80, ALT 15  Urinary retention/BPH -Had indwelling catheter previously.  Since he has completed greater than 6 months of therapy, okay to proceed with TURP if necessary.   Medication Adjustments/Labs and Tests Ordered: Current medicines are reviewed at length with the patient today.  Concerns regarding medicines are outlined above.  Orders Placed This Encounter  Procedures  . ALT  . Lipid panel  . MYOCARDIAL PERFUSION IMAGING   Meds ordered this encounter  Medications  . DISCONTD: rosuvastatin (CRESTOR) 20  MG tablet    Sig: Take 1 tablet (20 mg total) by mouth daily.    Dispense:  90 tablet    Refill:  3  . rosuvastatin (CRESTOR) 20 MG tablet    Sig: Take 1 tablet (20 mg total) by mouth daily.    Dispense:  90 tablet    Refill:  3    Patient Instructions  Medication Instructions:  Start Crestor 20mg  daily. Continue all other medications as listed.  *If you need a refill on your cardiac medications before your next appointment, please call your pharmacy*  Lab Work: None today  Please have blood work in 3 months (Lipid/ALT)  If you have labs (blood work) drawn today and your tests are completely normal, you will receive your results only by: MyChart Message (if you have MyChart) OR . A paper copy in the mail If you have any lab test that is abnormal or we need to change your treatment, we will call you to review the results.  Testing/Procedures: Your physician has requested that you have a lexiscan myoview. For further information please visit [facility]. Please follow instruction sheet, as given.  Follow-Up: At [facility], you and your health needs are our priority.  As part of our continuing mission to provide you with exceptional heart care, we have created designated Provider Care Teams.  These Care Teams include your primary Cardiologist (physician) and Advanced Practice Providers (APPs -  Physician Assistants and Nurse Practitioners) who all work together to provide you with the care you need, when you need it.     Your next appointment:   6 month(s)  The format for your next appointment:   In Person  Provider:   You may see Dr [facility] or one of the following Advanced Practice Providers on your designated Care Team:    Donato Schultz, NP         Signed, Georgie Chard, MD  01/01/2021 9:31 AM    [facility]

## 2021-01-01 NOTE — Patient Instructions (Addendum)
Medication Instructions:  Start Crestor 20mg  daily. Continue all other medications as listed.  *If you need a refill on your cardiac medications before your next appointment, please call your pharmacy*  Lab Work: None today  Please have blood work in 3 months (Lipid/ALT)  If you have labs (blood work) drawn today and your tests are completely normal, you will receive your results only by: MyChart Message (if you have MyChart) OR . A paper copy in the mail If you have any lab test that is abnormal or we need to change your treatment, we will call you to review the results.  Testing/Procedures: Your physician has requested that you have a lexiscan myoview. For further information please visit [facility]. Please follow instruction sheet, as given.  Follow-Up: At [facility], you and your health needs are our priority.  As part of our continuing mission to provide you with exceptional heart care, we have created designated Provider Care Teams.  These Care Teams include your primary Cardiologist (physician) and Advanced Practice Providers (APPs -  Physician Assistants and Nurse Practitioners) who all work together to provide you with the care you need, when you need it.     Your next appointment:   6 month(s)  The format for your next appointment:   In Person  Provider:   You may see Dr [facility] or one of the following Advanced Practice Providers on your designated Care Team:    Donato Schultz, NP

## 2021-01-05 ENCOUNTER — Telehealth: Payer: Self-pay

## 2021-01-05 ENCOUNTER — Telehealth (HOSPITAL_COMMUNITY): Payer: Self-pay | Admitting: *Deleted

## 2021-01-05 DIAGNOSIS — R079 Chest pain, unspecified: Secondary | ICD-10-CM

## 2021-01-05 NOTE — Telephone Encounter (Signed)
Myoview attestation order placed for Dr. Anne Fu to sign.

## 2021-01-05 NOTE — Telephone Encounter (Signed)
Patient given detailed instructions per Myocardial Perfusion Study Information Sheet for the test on 01/08/21 at 7:30. Patient notified to arrive 15 minutes early and that it is imperative to arrive on time for appointment to keep from having the test rescheduled.  If you need to cancel or reschedule your appointment, please call the office within 24 hours of your appointment. . Patient verbalized understanding.Brian Savage

## 2021-01-08 ENCOUNTER — Ambulatory Visit (HOSPITAL_COMMUNITY): Payer: Medicare Other | Attending: Cardiology

## 2021-01-08 ENCOUNTER — Other Ambulatory Visit: Payer: Self-pay

## 2021-01-08 DIAGNOSIS — R0600 Dyspnea, unspecified: Secondary | ICD-10-CM

## 2021-01-08 LAB — MYOCARDIAL PERFUSION IMAGING
LV dias vol: 106 mL (ref 62–150)
LV sys vol: 61 mL
Peak HR: 90 {beats}/min
Rest HR: 71 {beats}/min
SDS: 2
SRS: 0
SSS: 2
TID: 1.1

## 2021-01-08 MED ORDER — TECHNETIUM TC 99M TETROFOSMIN IV KIT
30.8000 | PACK | Freq: Once | INTRAVENOUS | Status: AC | PRN
  Administered 2021-01-08: 30.8 via INTRAVENOUS
  Filled 2021-01-08: qty 31

## 2021-01-08 MED ORDER — TECHNETIUM TC 99M TETROFOSMIN IV KIT
10.4000 | PACK | Freq: Once | INTRAVENOUS | Status: AC | PRN
  Administered 2021-01-08: 10.4 via INTRAVENOUS
  Filled 2021-01-08: qty 11

## 2021-01-08 MED ORDER — REGADENOSON 0.4 MG/5ML IV SOLN
0.4000 mg | Freq: Once | INTRAVENOUS | Status: AC
  Administered 2021-01-08: 0.4 mg via INTRAVENOUS

## 2021-01-11 ENCOUNTER — Telehealth: Payer: Self-pay

## 2021-01-11 NOTE — Telephone Encounter (Signed)
RN called patient and stated Dr. Anne Fu recommended patient to come in and discuss the possibility of a cardiac cathization d/t an abnormal stress test. Appt made for 01/15/2021 at 10:40am

## 2021-01-13 ENCOUNTER — Other Ambulatory Visit: Payer: TRICARE For Life (TFL)

## 2021-01-13 ENCOUNTER — Ambulatory Visit: Payer: Medicare Other | Attending: Internal Medicine

## 2021-01-13 DIAGNOSIS — Z20822 Contact with and (suspected) exposure to covid-19: Secondary | ICD-10-CM

## 2021-01-14 DIAGNOSIS — N3000 Acute cystitis without hematuria: Secondary | ICD-10-CM | POA: Diagnosis not present

## 2021-01-14 DIAGNOSIS — Z1159 Encounter for screening for other viral diseases: Secondary | ICD-10-CM | POA: Diagnosis not present

## 2021-01-14 LAB — SARS-COV-2, NAA 2 DAY TAT

## 2021-01-14 LAB — NOVEL CORONAVIRUS, NAA: SARS-CoV-2, NAA: NOT DETECTED

## 2021-01-15 ENCOUNTER — Encounter: Payer: Self-pay | Admitting: Cardiology

## 2021-01-15 ENCOUNTER — Other Ambulatory Visit: Payer: Self-pay

## 2021-01-15 ENCOUNTER — Ambulatory Visit (INDEPENDENT_AMBULATORY_CARE_PROVIDER_SITE_OTHER): Payer: Medicare Other | Admitting: Cardiology

## 2021-01-15 VITALS — BP 130/80 | HR 83 | Ht 74.0 in | Wt 228.0 lb

## 2021-01-15 DIAGNOSIS — Z01812 Encounter for preprocedural laboratory examination: Secondary | ICD-10-CM | POA: Diagnosis not present

## 2021-01-15 DIAGNOSIS — R9439 Abnormal result of other cardiovascular function study: Secondary | ICD-10-CM | POA: Diagnosis not present

## 2021-01-15 NOTE — H&P (View-Only) (Signed)
Cardiology Office Note:    Date:  01/15/2021   ID:  Brian Curtis Iqbal, [name], DOB 05/18/1951, MRN [id number]  PCP:  Griffin, John, MD   [facility]  Cardiologist:  Grayland Daisey, MD  Advanced Practice Provider:  No care team member to display Electrophysiologist:  None       Referring MD: Griffin, John, MD     History of Present Illness:    Brian Curtis Fredericksen, [name] is a 69 y.o. male here to discuss cardiac catheterization after recent stress test.  Has had coronary artery calcification/coronary artery disease aortic atherosclerosis and biventricular failure.  Previously hospitalized in June 2021 with shortness of breath.  He was diagnosed at that time with bilateral PE and right heart strain.  Echocardiogram showed EF of 40 to 45%.  Thankfully, repeat echocardiogram in 2021 showed normalization of his LV function.  At last visit we went over his coronary artery calcification/disease in his LAD and we checked a stress test.  EF was 42% on stress test with distal LAD territory ischemia.  Would like to proceed with angiogram.  He does state that he exercises on treadmill watches his heart rate closely.  He has not been experiencing any significant anginal symptoms.  The coronary calcification prompted the stress test.  He is going with his wife on a trip to [place].  We did discuss at length cautions.  He knows to seek medical attention if symptoms occur.  In an ideal situation, we would have angiogram performed prior.  I counseled him on not flying or leaving the country initially based upon results of stress testing.  In one aspect, since he is not having any significant anginal symptoms and the area of ischemia/possible infarction is fairly small on nuclear stress test, it is more on the intermediate risk range.  In other words, he does not seem to be in an unstable situation acutely.  Past Medical History:  Diagnosis Date  . BPH (benign prostatic  hyperplasia)   . Diabetes mellitus without complication (HCC)   . Essential hypertension     Past Surgical History:  Procedure Laterality Date  . CHOLECYSTECTOMY    . ROTATOR CUFF REPAIR      Current Medications: Current Meds  Medication Sig  . ACCU-CHEK AVIVA PLUS test strip CHECK BLOOD SUGARS ALTERNATING BEFORE AND AFTER MEALS DAILY  . APIXABAN (ELIQUIS) VTE STARTER PACK (10MG AND 5MG) Take as directed on package: start with two-5mg tablets twice daily for 7 days. On day 8, switch to one-5mg tablet twice daily. (Patient taking differently: Take 5 mg by mouth 2 (two) times daily. Take as directed on package: start with two-5mg tablets twice daily for 7 days. On day 8, switch to one-5mg tablet twice daily.)  . cholecalciferol (VITAMIN D3) 25 MCG (1000 UNIT) tablet Take 2,000 Units by mouth daily.  . glimepiride (AMARYL) 4 MG tablet Take 2 mg by mouth in the morning and at bedtime.   . Magnesium 400 MG CAPS Take 400 mg by mouth daily.  . metFORMIN (GLUCOPHAGE-XR) 500 MG 24 hr tablet Take 1,000 mg by mouth 2 (two) times daily.  . metoprolol succinate (TOPROL-XL) 50 MG 24 hr tablet Take 1 tablet (50 mg total) by mouth daily. Take with or immediately following a meal.  . Multiple Vitamin (MULTIVITAMIN) tablet Take 1 tablet by mouth daily.  . rosuvastatin (CRESTOR) 20 MG tablet Take 1 tablet (20 mg total) by mouth daily.  . telmisartan-hydrochlorothiazide (MICARDIS HCT) 80-25 MG per   tablet Take 1 tablet by mouth daily.   Marland Kitchen zinc gluconate 50 MG tablet Take 50 mg by mouth daily.     Allergies:   Patient has no known allergies.   Social History   Socioeconomic History  . Marital status: Married    Spouse name: Not on file  . Number of children: Not on file  . Years of education: Not on file  . Highest education level: Not on file  Occupational History  . Occupation: Firefighter  Tobacco Use  . Smoking status: Never Smoker  . Smokeless tobacco: Never Used  Substance and  Sexual Activity  . Alcohol use: Yes    Comment: occasional  . Drug use: Never  . Sexual activity: Not on file  Other Topics Concern  . Not on file  Social History Narrative  . Not on file   Social Determinants of Health   Financial Resource Strain: Not on file  Food Insecurity: Not on file  Transportation Needs: Not on file  Physical Activity: Not on file  Stress: Not on file  Social Connections: Not on file     Family History: The patient's family history includes Stroke in his father and mother. There is no history of Clotting disorder or Cancer.  ROS:   Please see the history of present illness.     All other systems reviewed and are negative.  EKGs/Labs/Other Studies Reviewed:    The following studies were reviewed today:  LAD calcification noted on CT scan from 04/13/2020 personally reviewed.  NUC stress 01/08/21:   The left ventricular ejection fraction is moderately decreased (30-44%).  Nuclear stress EF: 42%.  There was no ST segment deviation noted during stress.  Defect 1: There is a small defect of severe severity present in the apex location.  This is an intermediate risk study.  Findings consistent with prior myocardial infarction with peri-infarct ischemia.   Mild global hypokinesis. There is a small, severe defect at the apex at rest that is slightly worse with stress. Concerning for infarct with peri-infarct ischemia.   EKG:  EKG is  ordered today.  The ekg ordered today demonstrates sinus rhythm 83 with no other specific abnormalities.  Recent Labs: 04/13/2020: ALT 28; B Natriuretic Peptide 426.3; Hemoglobin 13.5; Platelets 230 04/14/2020: BUN 18; Creatinine, Ser 1.07; Potassium 3.8; Sodium 138  Recent Lipid Panel No results found for: CHOL, TRIG, HDL, CHOLHDL, VLDL, LDLCALC, LDLDIRECT   Risk Assessment/Calculations:      Physical Exam:    VS:  BP 130/80 (BP Location: Left Arm, Patient Position: Sitting, Cuff Size: Normal)   Pulse 83    Ht 6\' 2"  (1.88 m)   Wt 228 lb (103.4 kg)   SpO2 95%   BMI 29.27 kg/m     Wt Readings from Last 3 Encounters:  01/15/21 228 lb (103.4 kg)  01/08/21 232 lb (105.2 kg)  01/01/21 232 lb (105.2 kg)     GEN:  Well nourished, well developed in no acute distress HEENT: Normal NECK: No JVD; No carotid bruits LYMPHATICS: No lymphadenopathy CARDIAC: RRR, no murmurs, rubs, gallops RESPIRATORY:  Clear to auscultation without rales, wheezing or rhonchi  ABDOMEN: Soft, non-tender, non-distended MUSCULOSKELETAL:  No edema; No deformity  SKIN: Warm and dry NEUROLOGIC:  Alert and oriented x 3 PSYCHIATRIC:  Normal affect   ASSESSMENT:    1. Pre-procedure lab exam   2. Abnormal cardiovascular stress test    PLAN:    In order of problems listed above:  Abnormal stress  test -Possible LAD ischemia.  LAD calcification seen on CT scan.  EF once again 42%.  Previously EF had been reported as normalized. -We will go ahead and proceed with cardiac catheterization.  Risks and benefits have been explained including stroke heart attack death renal impairment bleeding.  See above in HPI for details on timing./10/22-here for follow-up of atrial fibrillation.  Prior pulmonary embolism unprovoked with right ventricular heart strain -On chronic Eliquis.  We will hold for 2 days prior to heart catheterization.  Essential hypertension -Fairly stable.  On Toprol.  Diabetes with hypertension -Prior hemoglobin A1c 7.6, Dr. Valentina Lucks has been watching.   Shared Decision Making/Informed Consent The risks [stroke (1 in 1000), death (1 in 1000), kidney failure [usually temporary] (1 in 500), bleeding (1 in 200), allergic reaction [possibly serious] (1 in 200)], benefits (diagnostic support and management of coronary artery disease) and alternatives of a cardiac catheterization were discussed in detail with Brian Savage and he is willing to proceed.        Medication Adjustments/Labs and Tests  Ordered: Current medicines are reviewed at length with the patient today.  Concerns regarding medicines are outlined above.  Orders Placed This Encounter  Procedures  . CBC  . Basic metabolic panel  . EKG 12-Lead   No orders of the defined types were placed in this encounter.   Patient Instructions  Medication Instructions:  The current medical regimen is effective;  continue present plan and medications.  *If you need a refill on your cardiac medications before your next appointment, please call your pharmacy*  Lab Work: Will need to return a few days before your heart cath. If you have labs (blood work) drawn today and your tests are completely normal, you will receive your results only by: Marland Kitchen MyChart Message (if you have MyChart) OR . A paper copy in the mail If you have any lab test that is abnormal or we need to change your treatment, we will call you to review the results.  Testing/Procedures: Your physician has requested that you have a cardiac catheterization. Cardiac catheterization is used to diagnose and/or treat various heart conditions. Doctors may recommend this procedure for a number of different reasons. The most common reason is to evaluate chest pain. Chest pain can be a symptom of coronary artery disease (CAD), and cardiac catheterization can show whether plaque is narrowing or blocking your heart's arteries. This procedure is also used to evaluate the valves, as well as measure the blood flow and oxygen levels in different parts of your heart. For further information please visit [email or web address] Please follow instruction sheet, as given.    [facility] CARDIOVASCULAR DIVISION [facility] ST OFFICE [address] [place] [address] Dept: [phone] Loc: [phone]  Brian Fries, [name]  01/15/2021  You are scheduled for a cardiac cath on Friday April 1 with Dr. Eldridge Dace .  1. Please arrive at  the [facility] (Main Entrance A) at [facility]: [address] [place], [place] [address] at  5:30 am  (two hours before your procedure to ensure your preparation). Free valet parking service is available.   Special note: Every effort is made to have your procedure done on time. Please understand that emergencies sometimes delay scheduled procedures.  2. Diet: Nothing after midnight the night before.  3. Labs: Will need lab work (complete here) a few days before as well as Covid screening.  4. Medication instructions in preparation for your  procedure: Hold Eliquis 2 days before your cardiac cath. Hold Metformin and Glimepiride the morning of your heart cath.  You may take your other medication this AM with sips of water.  5. Plan for one night stay--bring personal belongings. 6. Bring a current list of your medications and current insurance cards. 7. You MUST have a responsible person to drive you home. 8. Someone MUST be with you the first 24 hours after you arrive home or your discharge will be delayed. 9. Please wear clothes that are easy to get on and off and wear slip-on shoes.  Thank you for allowing [place] to care for you!   -- [facility]  Follow-Up: At [facility], you and your health needs are our priority.  As part of our continuing mission to provide you with exceptional heart care, we have created designated Provider Care Teams.  These Care Teams include your primary Cardiologist (physician) and Advanced Practice Providers (APPs -  Physician Assistants and Nurse Practitioners) who all work together to provide you with the care you need, when you need it.  We recommend signing up for the patient portal called "MyChart".  Sign up information is provided on this After Visit Summary.  MyChart is used to connect with patients for Virtual Visits (Telemedicine).  Patients are able to view lab/test results, encounter notes, upcoming  appointments, etc.  Non-urgent messages can be sent to your provider as well.   To learn more about what you can do with MyChart, go to [email or web address].    Your next appointment:   Follow up will be determined after your cardiac cath.   Thank you for choosing [facility]!!     Please call the office once you return to be scheduled for your lab work, Covid screening and heart cath.  [phone].  Thank you    Signed, Donato Schultz, MD  01/15/2021 12:01 PM     [facility]

## 2021-01-15 NOTE — Patient Instructions (Addendum)
Medication Instructions:  The current medical regimen is effective;  continue present plan and medications.  *If you need a refill on your cardiac medications before your next appointment, please call your pharmacy*  Lab Work: Will need to return a few days before your heart cath. If you have labs (blood work) drawn today and your tests are completely normal, you will receive your results only by: Marland Kitchen MyChart Message (if you have MyChart) OR . A paper copy in the mail If you have any lab test that is abnormal or we need to change your treatment, we will call you to review the results.  Testing/Procedures: Your physician has requested that you have a cardiac catheterization. Cardiac catheterization is used to diagnose and/or treat various heart conditions. Doctors may recommend this procedure for a number of different reasons. The most common reason is to evaluate chest pain. Chest pain can be a symptom of coronary artery disease (CAD), and cardiac catheterization can show whether plaque is narrowing or blocking your heart's arteries. This procedure is also used to evaluate the valves, as well as measure the blood flow and oxygen levels in different parts of your heart. For further information please visit [email or web address] Please follow instruction sheet, as given.    [facility] CARDIOVASCULAR DIVISION [facility] ST OFFICE [address] [place] [address] Dept: [phone] Loc: [phone]  [name], [name]  01/15/2021  You are scheduled for a cardiac cath on Friday April 1 with Dr. Eldridge Dace .  1. Please arrive at the [facility] (Main Entrance A) at [facility]: [address] [place], [place] [address] at  5:30 am  (two hours before your procedure to ensure your preparation). Free valet parking service is available.   Special note: Every effort is made to have your procedure done on time. Please understand  that emergencies sometimes delay scheduled procedures.  2. Diet: Nothing after midnight the night before.  3. Labs: Will need lab work (complete here) a few days before as well as Covid screening.  4. Medication instructions in preparation for your procedure: Hold Eliquis 2 days before your cardiac cath. Hold Metformin and Glimepiride the morning of your heart cath.  You may take your other medication this AM with sips of water.  5. Plan for one night stay--bring personal belongings. 6. Bring a current list of your medications and current insurance cards. 7. You MUST have a responsible person to drive you home. 8. Someone MUST be with you the first 24 hours after you arrive home or your discharge will be delayed. 9. Please wear clothes that are easy to get on and off and wear slip-on shoes.  Thank you for allowing [place] to care for you!   -- [place] [facility]  Follow-Up: At [facility], you and your health needs are our priority.  As part of our continuing mission to provide you with exceptional heart care, we have created designated Provider Care Teams.  These Care Teams include your primary Cardiologist (physician) and Advanced Practice Providers (APPs -  Physician Assistants and Nurse Practitioners) who all work together to provide you with the care you need, when you need it.  We recommend signing up for the patient portal called "MyChart".  Sign up information is provided on this After Visit Summary.  MyChart is used to connect with patients for Virtual Visits (Telemedicine).  Patients are able to view lab/test results, encounter notes, upcoming appointments, etc.  Non-urgent messages can be sent to your provider as well.   To learn more about what you can do with MyChart, go to [email or web address].    Your next appointment:   Follow up will be determined after your cardiac cath.   Thank you for choosing [facility]!!     Please call  the office once you return to be scheduled for your lab work, Covid screening and heart cath.  [phone].  Thank you

## 2021-01-15 NOTE — Progress Notes (Signed)
Cardiology Office Note:    Date:  01/15/2021   ID:  Brian Fries, [name], DOB 1951/06/16, MRN [id number]  PCP:  Kirby Funk, MD   [facility]  Cardiologist:  Donato Schultz, MD  Advanced Practice Provider:  No care team member to display Electrophysiologist:  None       Referring MD: Kirby Funk, MD     History of Present Illness:    Brian Fries, [name] is a 70 y.o. male here to discuss cardiac catheterization after recent stress test.  Has had coronary artery calcification/coronary artery disease aortic atherosclerosis and biventricular failure.  Previously hospitalized in June 2021 with shortness of breath.  He was diagnosed at that time with bilateral PE and right heart strain.  Echocardiogram showed EF of 40 to 45%.  Thankfully, repeat echocardiogram in 2021 showed normalization of his LV function.  At last visit we went over his coronary artery calcification/disease in his LAD and we checked a stress test.  EF was 42% on stress test with distal LAD territory ischemia.  Would like to proceed with angiogram.  He does state that he exercises on treadmill watches his heart rate closely.  He has not been experiencing any significant anginal symptoms.  The coronary calcification prompted the stress test.  He is going with his wife on a trip to [place].  We did discuss at length cautions.  He knows to seek medical attention if symptoms occur.  In an ideal situation, we would have angiogram performed prior.  I counseled him on not flying or leaving the country initially based upon results of stress testing.  In one aspect, since he is not having any significant anginal symptoms and the area of ischemia/possible infarction is fairly small on nuclear stress test, it is more on the intermediate risk range.  In other words, he does not seem to be in an unstable situation acutely.  Past Medical History:  Diagnosis Date  . BPH (benign prostatic  hyperplasia)   . Diabetes mellitus without complication (HCC)   . Essential hypertension     Past Surgical History:  Procedure Laterality Date  . CHOLECYSTECTOMY    . ROTATOR CUFF REPAIR      Current Medications: Current Meds  Medication Sig  . ACCU-CHEK AVIVA PLUS test strip CHECK BLOOD SUGARS ALTERNATING BEFORE AND AFTER MEALS DAILY  . APIXABAN (ELIQUIS) VTE STARTER PACK (10MG  AND 5MG ) Take as directed on package: start with two-5mg  tablets twice daily for 7 days. On day 8, switch to one-5mg  tablet twice daily. (Patient taking differently: Take 5 mg by mouth 2 (two) times daily. Take as directed on package: start with two-5mg  tablets twice daily for 7 days. On day 8, switch to one-5mg  tablet twice daily.)  . cholecalciferol (VITAMIN D3) 25 MCG (1000 UNIT) tablet Take 2,000 Units by mouth daily.  Marland Kitchen glimepiride (AMARYL) 4 MG tablet Take 2 mg by mouth in the morning and at bedtime.   . Magnesium 400 MG CAPS Take 400 mg by mouth daily.  . metFORMIN (GLUCOPHAGE-XR) 500 MG 24 hr tablet Take 1,000 mg by mouth 2 (two) times daily.  . metoprolol succinate (TOPROL-XL) 50 MG 24 hr tablet Take 1 tablet (50 mg total) by mouth daily. Take with or immediately following a meal.  . Multiple Vitamin (MULTIVITAMIN) tablet Take 1 tablet by mouth daily.  . rosuvastatin (CRESTOR) 20 MG tablet Take 1 tablet (20 mg total) by mouth daily.  Marland Kitchen telmisartan-hydrochlorothiazide (MICARDIS HCT) 80-25 MG per  tablet Take 1 tablet by mouth daily.   Marland Kitchen zinc gluconate 50 MG tablet Take 50 mg by mouth daily.     Allergies:   Patient has no known allergies.   Social History   Socioeconomic History  . Marital status: Married    Spouse name: Not on file  . Number of children: Not on file  . Years of education: Not on file  . Highest education level: Not on file  Occupational History  . Occupation: Firefighter  Tobacco Use  . Smoking status: Never Smoker  . Smokeless tobacco: Never Used  Substance and  Sexual Activity  . Alcohol use: Yes    Comment: occasional  . Drug use: Never  . Sexual activity: Not on file  Other Topics Concern  . Not on file  Social History Narrative  . Not on file   Social Determinants of Health   Financial Resource Strain: Not on file  Food Insecurity: Not on file  Transportation Needs: Not on file  Physical Activity: Not on file  Stress: Not on file  Social Connections: Not on file     Family History: The patient's family history includes Stroke in his father and mother. There is no history of Clotting disorder or Cancer.  ROS:   Please see the history of present illness.     All other systems reviewed and are negative.  EKGs/Labs/Other Studies Reviewed:    The following studies were reviewed today:  LAD calcification noted on CT scan from 04/13/2020 personally reviewed.  NUC stress 01/08/21:   The left ventricular ejection fraction is moderately decreased (30-44%).  Nuclear stress EF: 42%.  There was no ST segment deviation noted during stress.  Defect 1: There is a small defect of severe severity present in the apex location.  This is an intermediate risk study.  Findings consistent with prior myocardial infarction with peri-infarct ischemia.   Mild global hypokinesis. There is a small, severe defect at the apex at rest that is slightly worse with stress. Concerning for infarct with peri-infarct ischemia.   EKG:  EKG is  ordered today.  The ekg ordered today demonstrates sinus rhythm 83 with no other specific abnormalities.  Recent Labs: 04/13/2020: ALT 28; B Natriuretic Peptide 426.3; Hemoglobin 13.5; Platelets 230 04/14/2020: BUN 18; Creatinine, Ser 1.07; Potassium 3.8; Sodium 138  Recent Lipid Panel No results found for: CHOL, TRIG, HDL, CHOLHDL, VLDL, LDLCALC, LDLDIRECT   Risk Assessment/Calculations:      Physical Exam:    VS:  BP 130/80 (BP Location: Left Arm, Patient Position: Sitting, Cuff Size: Normal)   Pulse 83    Ht 6\' 2"  (1.88 m)   Wt 228 lb (103.4 kg)   SpO2 95%   BMI 29.27 kg/m     Wt Readings from Last 3 Encounters:  01/15/21 228 lb (103.4 kg)  01/08/21 232 lb (105.2 kg)  01/01/21 232 lb (105.2 kg)     GEN:  Well nourished, well developed in no acute distress HEENT: Normal NECK: No JVD; No carotid bruits LYMPHATICS: No lymphadenopathy CARDIAC: RRR, no murmurs, rubs, gallops RESPIRATORY:  Clear to auscultation without rales, wheezing or rhonchi  ABDOMEN: Soft, non-tender, non-distended MUSCULOSKELETAL:  No edema; No deformity  SKIN: Warm and dry NEUROLOGIC:  Alert and oriented x 3 PSYCHIATRIC:  Normal affect   ASSESSMENT:    1. Pre-procedure lab exam   2. Abnormal cardiovascular stress test    PLAN:    In order of problems listed above:  Abnormal stress  test -Possible LAD ischemia.  LAD calcification seen on CT scan.  EF once again 42%.  Previously EF had been reported as normalized. -We will go ahead and proceed with cardiac catheterization.  Risks and benefits have been explained including stroke heart attack death renal impairment bleeding.  See above in HPI for details on timing./10/22-here for follow-up of atrial fibrillation.  Prior pulmonary embolism unprovoked with right ventricular heart strain -On chronic Eliquis.  We will hold for 2 days prior to heart catheterization.  Essential hypertension -Fairly stable.  On Toprol.  Diabetes with hypertension -Prior hemoglobin A1c 7.6, Dr. Valentina Lucks has been watching.   Shared Decision Making/Informed Consent The risks [stroke (1 in 1000), death (1 in 1000), kidney failure [usually temporary] (1 in 500), bleeding (1 in 200), allergic reaction [possibly serious] (1 in 200)], benefits (diagnostic support and management of coronary artery disease) and alternatives of a cardiac catheterization were discussed in detail with Mr. Sawatzky and he is willing to proceed.        Medication Adjustments/Labs and Tests  Ordered: Current medicines are reviewed at length with the patient today.  Concerns regarding medicines are outlined above.  Orders Placed This Encounter  Procedures  . CBC  . Basic metabolic panel  . EKG 12-Lead   No orders of the defined types were placed in this encounter.   Patient Instructions  Medication Instructions:  The current medical regimen is effective;  continue present plan and medications.  *If you need a refill on your cardiac medications before your next appointment, please call your pharmacy*  Lab Work: Will need to return a few days before your heart cath. If you have labs (blood work) drawn today and your tests are completely normal, you will receive your results only by: Marland Kitchen MyChart Message (if you have MyChart) OR . A paper copy in the mail If you have any lab test that is abnormal or we need to change your treatment, we will call you to review the results.  Testing/Procedures: Your physician has requested that you have a cardiac catheterization. Cardiac catheterization is used to diagnose and/or treat various heart conditions. Doctors may recommend this procedure for a number of different reasons. The most common reason is to evaluate chest pain. Chest pain can be a symptom of coronary artery disease (CAD), and cardiac catheterization can show whether plaque is narrowing or blocking your heart's arteries. This procedure is also used to evaluate the valves, as well as measure the blood flow and oxygen levels in different parts of your heart. For further information please visit [email or web address] Please follow instruction sheet, as given.    [facility] CARDIOVASCULAR DIVISION [facility] ST OFFICE [address] [place] [address] Dept: [phone] Loc: [phone]  Brian Fries, [name]  01/15/2021  You are scheduled for a cardiac cath on Friday April 1 with Dr. Eldridge Dace .  1. Please arrive at  the [facility] (Main Entrance A) at [facility]: [address] [place], [place] [address] at  5:30 am  (two hours before your procedure to ensure your preparation). Free valet parking service is available.   Special note: Every effort is made to have your procedure done on time. Please understand that emergencies sometimes delay scheduled procedures.  2. Diet: Nothing after midnight the night before.  3. Labs: Will need lab work (complete here) a few days before as well as Covid screening.  4. Medication instructions in preparation for your  procedure: Hold Eliquis 2 days before your cardiac cath. Hold Metformin and Glimepiride the morning of your heart cath.  You may take your other medication this AM with sips of water.  5. Plan for one night stay--bring personal belongings. 6. Bring a current list of your medications and current insurance cards. 7. You MUST have a responsible person to drive you home. 8. Someone MUST be with you the first 24 hours after you arrive home or your discharge will be delayed. 9. Please wear clothes that are easy to get on and off and wear slip-on shoes.  Thank you for allowing [place] to care for you!   -- [place] [facility]  Follow-Up: At [facility], you and your health needs are our priority.  As part of our continuing mission to provide you with exceptional heart care, we have created designated Provider Care Teams.  These Care Teams include your primary Cardiologist (physician) and Advanced Practice Providers (APPs -  Physician Assistants and Nurse Practitioners) who all work together to provide you with the care you need, when you need it.  We recommend signing up for the patient portal called "MyChart".  Sign up information is provided on this After Visit Summary.  MyChart is used to connect with patients for Virtual Visits (Telemedicine).  Patients are able to view lab/test results, encounter notes, upcoming  appointments, etc.  Non-urgent messages can be sent to your provider as well.   To learn more about what you can do with MyChart, go to [email or web address].    Your next appointment:   Follow up will be determined after your cardiac cath.   Thank you for choosing [facility]!!     Please call the office once you return to be scheduled for your lab work, Covid screening and heart cath.  [phone].  Thank you    Signed, Donato Schultz, MD  01/15/2021 12:01 PM     [facility]

## 2021-01-20 NOTE — Addendum Note (Signed)
Addended by: Jake Bathe on: 01/20/2021 04:30 PM   Modules accepted: Orders, SmartSet

## 2021-01-27 ENCOUNTER — Other Ambulatory Visit (HOSPITAL_COMMUNITY)
Admission: RE | Admit: 2021-01-27 | Discharge: 2021-01-27 | Disposition: A | Discharge status: Home / Self Care | Payer: Medicare Other | Source: Ambulatory Visit | Attending: Interventional Cardiology | Admitting: Interventional Cardiology

## 2021-01-27 ENCOUNTER — Other Ambulatory Visit: Payer: Medicare Other | Admitting: *Deleted

## 2021-01-27 ENCOUNTER — Other Ambulatory Visit: Payer: Self-pay

## 2021-01-27 DIAGNOSIS — R9439 Abnormal result of other cardiovascular function study: Secondary | ICD-10-CM | POA: Diagnosis not present

## 2021-01-27 DIAGNOSIS — Z01812 Encounter for preprocedural laboratory examination: Secondary | ICD-10-CM | POA: Diagnosis not present

## 2021-01-27 DIAGNOSIS — Z20822 Contact with and (suspected) exposure to covid-19: Secondary | ICD-10-CM | POA: Diagnosis not present

## 2021-01-27 LAB — CBC
Hematocrit: 39.8 % (ref 37.5–51.0)
Hemoglobin: 13.4 g/dL (ref 13.0–17.7)
MCH: 30.2 pg (ref 26.6–33.0)
MCHC: 33.7 g/dL (ref 31.5–35.7)
MCV: 90 fL (ref 79–97)
Platelets: 266 10*3/uL (ref 150–450)
RBC: 4.44 x10E6/uL (ref 4.14–5.80)
RDW: 13.4 % (ref 11.6–15.4)
WBC: 10.7 10*3/uL (ref 3.4–10.8)

## 2021-01-27 LAB — BASIC METABOLIC PANEL
BUN/Creatinine Ratio: 17 (ref 10–24)
BUN: 16 mg/dL (ref 8–27)
CO2: 31 mmol/L — ABNORMAL HIGH (ref 20–29)
Calcium: 9.5 mg/dL (ref 8.6–10.2)
Chloride: 104 mmol/L (ref 96–106)
Creatinine, Ser: 0.93 mg/dL (ref 0.76–1.27)
Glucose: 140 mg/dL — ABNORMAL HIGH (ref 65–99)
Potassium: 4.3 mmol/L (ref 3.5–5.2)
Sodium: 143 mmol/L (ref 134–144)
eGFR: 89 mL/min/{1.73_m2} (ref >59)

## 2021-01-27 LAB — SARS CORONAVIRUS 2 (TAT 6-24 HRS): SARS Coronavirus 2: NEGATIVE

## 2021-01-28 ENCOUNTER — Telehealth: Payer: Self-pay | Admitting: *Deleted

## 2021-01-28 NOTE — Telephone Encounter (Signed)
Pt contacted pre-catheterization scheduled at [facility] for: Friday January 29, 2021 7:30 AM Verified arrival time and place: [facility] Main Entrance A [facility]) at: 5:30 AM   No solid food after midnight prior to cath, clear liquids until 5 AM day of procedure.  Hold: Eliquis-none 01/27/21 until post procedure Metformin-day of procedure and 48 hours post procedure Glimepiride-AM of procedure Micardis-AM of procedure  Except hold medications AM meds can be  taken pre-cath with sips of water including: ASA 81 mg   Confirmed patient has responsible adult to drive home post procedure and be with patient first 24 hours after arriving home: yes  You are allowed ONE visitor in the waiting room during the time you are at the hospital for your procedure. Both you and your visitor must wear a mask once you enter the hospital.   Reviewed procedure /mask/visitor instructions with patient.

## 2021-01-29 ENCOUNTER — Ambulatory Visit (HOSPITAL_COMMUNITY)
Admission: RE | Admit: 2021-01-29 | Discharge: 2021-01-29 | Disposition: A | Discharge status: Home / Self Care | Payer: Medicare Other | Attending: Interventional Cardiology | Admitting: Interventional Cardiology

## 2021-01-29 ENCOUNTER — Encounter (HOSPITAL_COMMUNITY): Payer: Self-pay | Admitting: Interventional Cardiology

## 2021-01-29 ENCOUNTER — Encounter (HOSPITAL_COMMUNITY)
Admission: RE | Disposition: A | Discharge status: Home / Self Care | Payer: Self-pay | Source: Home / Self Care | Attending: Interventional Cardiology

## 2021-01-29 DIAGNOSIS — Z7901 Long term (current) use of anticoagulants: Secondary | ICD-10-CM | POA: Diagnosis not present

## 2021-01-29 DIAGNOSIS — R9439 Abnormal result of other cardiovascular function study: Secondary | ICD-10-CM | POA: Diagnosis not present

## 2021-01-29 DIAGNOSIS — I10 Essential (primary) hypertension: Secondary | ICD-10-CM | POA: Diagnosis not present

## 2021-01-29 DIAGNOSIS — Z7984 Long term (current) use of oral hypoglycemic drugs: Secondary | ICD-10-CM | POA: Insufficient documentation

## 2021-01-29 DIAGNOSIS — Z79899 Other long term (current) drug therapy: Secondary | ICD-10-CM | POA: Diagnosis not present

## 2021-01-29 DIAGNOSIS — E119 Type 2 diabetes mellitus without complications: Secondary | ICD-10-CM | POA: Diagnosis not present

## 2021-01-29 DIAGNOSIS — I2510 Atherosclerotic heart disease of native coronary artery without angina pectoris: Secondary | ICD-10-CM | POA: Diagnosis not present

## 2021-01-29 DIAGNOSIS — Z86711 Personal history of pulmonary embolism: Secondary | ICD-10-CM | POA: Diagnosis not present

## 2021-01-29 DIAGNOSIS — Z01812 Encounter for preprocedural laboratory examination: Secondary | ICD-10-CM

## 2021-01-29 HISTORY — PX: LEFT HEART CATH AND CORONARY ANGIOGRAPHY: CATH118249

## 2021-01-29 LAB — GLUCOSE, CAPILLARY: Glucose-Capillary: 190 mg/dL — ABNORMAL HIGH (ref 70–99)

## 2021-01-29 SURGERY — LEFT HEART CATH AND CORONARY ANGIOGRAPHY
Anesthesia: LOCAL

## 2021-01-29 MED ORDER — FENTANYL CITRATE (PF) 100 MCG/2ML IJ SOLN
INTRAMUSCULAR | Status: DC | PRN
  Administered 2021-01-29: 25 ug via INTRAVENOUS

## 2021-01-29 MED ORDER — METFORMIN HCL ER 500 MG PO TB24: 1000.0000 mg | ORAL_TABLET | Freq: Two times a day (BID) | ORAL | Status: AC

## 2021-01-29 MED ORDER — SODIUM CHLORIDE 0.9 % IV SOLN: 250.0000 mL | INTRAVENOUS | Status: DC | PRN

## 2021-01-29 MED ORDER — VERAPAMIL HCL 2.5 MG/ML IV SOLN
INTRAVENOUS | Status: AC
  Filled 2021-01-29: qty 2

## 2021-01-29 MED ORDER — HYDRALAZINE HCL 20 MG/ML IJ SOLN: 10.0000 mg | INTRAMUSCULAR | Status: DC | PRN

## 2021-01-29 MED ORDER — HEPARIN SODIUM (PORCINE) 1000 UNIT/ML IJ SOLN
INTRAMUSCULAR | Status: AC
  Filled 2021-01-29: qty 1

## 2021-01-29 MED ORDER — LIDOCAINE HCL (PF) 1 % IJ SOLN
INTRAMUSCULAR | Status: DC | PRN
  Administered 2021-01-29: 2 mL via INTRADERMAL

## 2021-01-29 MED ORDER — HEPARIN (PORCINE) IN NACL 1000-0.9 UT/500ML-% IV SOLN
INTRAVENOUS | Status: AC
  Filled 2021-01-29: qty 1000

## 2021-01-29 MED ORDER — SODIUM CHLORIDE 0.9% FLUSH: 3.0000 mL | INTRAVENOUS | Status: DC | PRN

## 2021-01-29 MED ORDER — MIDAZOLAM HCL 2 MG/2ML IJ SOLN
INTRAMUSCULAR | Status: DC | PRN
  Administered 2021-01-29: 2 mg via INTRAVENOUS

## 2021-01-29 MED ORDER — SODIUM CHLORIDE 0.9 % WEIGHT BASED INFUSION: 1.0000 mL/kg/h | INTRAVENOUS | Status: DC

## 2021-01-29 MED ORDER — LABETALOL HCL 5 MG/ML IV SOLN: 10.0000 mg | INTRAVENOUS | Status: DC | PRN

## 2021-01-29 MED ORDER — VERAPAMIL HCL 2.5 MG/ML IV SOLN
INTRAVENOUS | Status: DC | PRN
  Administered 2021-01-29: 10 mL via INTRA_ARTERIAL

## 2021-01-29 MED ORDER — HEPARIN SODIUM (PORCINE) 1000 UNIT/ML IJ SOLN
INTRAMUSCULAR | Status: DC | PRN
  Administered 2021-01-29: 5000 [IU] via INTRAVENOUS

## 2021-01-29 MED ORDER — ONDANSETRON HCL 4 MG/2ML IJ SOLN: 4.0000 mg | Freq: Four times a day (QID) | INTRAMUSCULAR | Status: DC | PRN

## 2021-01-29 MED ORDER — HEPARIN (PORCINE) IN NACL 1000-0.9 UT/500ML-% IV SOLN
INTRAVENOUS | Status: DC | PRN
  Administered 2021-01-29 (×2): 500 mL

## 2021-01-29 MED ORDER — APIXABAN (ELIQUIS) VTE STARTER PACK (10MG AND 5MG): ORAL_TABLET | ORAL | 0 refills | Status: AC

## 2021-01-29 MED ORDER — SODIUM CHLORIDE 0.9 % IV SOLN: INTRAVENOUS | Status: AC

## 2021-01-29 MED ORDER — MIDAZOLAM HCL 2 MG/2ML IJ SOLN
INTRAMUSCULAR | Status: AC
  Filled 2021-01-29: qty 2

## 2021-01-29 MED ORDER — SODIUM CHLORIDE 0.9 % WEIGHT BASED INFUSION
3.0000 mL/kg/h | INTRAVENOUS | Status: AC
  Administered 2021-01-29: 3 mL/kg/h via INTRAVENOUS

## 2021-01-29 MED ORDER — IOHEXOL 350 MG/ML SOLN
INTRAVENOUS | Status: DC | PRN
  Administered 2021-01-29: 50 mL via INTRA_ARTERIAL

## 2021-01-29 MED ORDER — LIDOCAINE HCL (PF) 1 % IJ SOLN
INTRAMUSCULAR | Status: AC
  Filled 2021-01-29: qty 30

## 2021-01-29 MED ORDER — SODIUM CHLORIDE 0.9% FLUSH: 3.0000 mL | Freq: Two times a day (BID) | INTRAVENOUS | Status: DC

## 2021-01-29 MED ORDER — ACETAMINOPHEN 325 MG PO TABS: 650.0000 mg | ORAL_TABLET | ORAL | Status: DC | PRN

## 2021-01-29 MED ORDER — ASPIRIN 81 MG PO CHEW
81.0000 mg | CHEWABLE_TABLET | ORAL | Status: AC
  Administered 2021-01-29: 81 mg via ORAL
  Filled 2021-01-29: qty 1

## 2021-01-29 MED ORDER — FENTANYL CITRATE (PF) 100 MCG/2ML IJ SOLN
INTRAMUSCULAR | Status: AC
  Filled 2021-01-29: qty 2

## 2021-01-29 SURGICAL SUPPLY — 9 items
CATH 5FR JL3.5 JR4 ANG PIG MP (CATHETERS) ×2 IMPLANT
DEVICE RAD COMP TR BAND LRG (VASCULAR PRODUCTS) ×2 IMPLANT
GLIDESHEATH SLEND SS 6F .021 (SHEATH) ×2 IMPLANT
GUIDEWIRE INQWIRE 1.5J.035X260 (WIRE) ×1 IMPLANT
INQWIRE 1.5J .035X260CM (WIRE) ×2
KIT HEART LEFT (KITS) ×2 IMPLANT
PACK CARDIAC CATHETERIZATION (CUSTOM PROCEDURE TRAY) ×2 IMPLANT
TRANSDUCER W/STOPCOCK (MISCELLANEOUS) ×2 IMPLANT
TUBING CIL FLEX 10 FLL-RA (TUBING) ×2 IMPLANT

## 2021-01-29 NOTE — Discharge Instructions (Signed)
Radial Site Care  This sheet gives you information about how to care for yourself after your procedure. Your health care provider may also give you more specific instructions. If you have problems or questions, contact your health care provider. What can I expect after the procedure? After the procedure, it is common to have:  Bruising and tenderness at the catheter insertion area. Follow these instructions at home: Medicines  Take over-the-counter and prescription medicines only as told by your health care provider. Insertion site care 1. Follow instructions from your health care provider about how to take care of your insertion site. Make sure you: ? Wash your hands with soap and water before you remove your bandage (dressing). If soap and water are not available, use hand sanitizer. ? May remove dressing in 24 hours. 2. Check your insertion site every day for signs of infection. Check for: ? Redness, swelling, or pain. ? Fluid or blood. ? Pus or a bad smell. ? Warmth. 3. Do no take baths, swim, or use a hot tub for 5 days. 4. You may shower 24-48 hours after the procedure. ? Remove the dressing and gently wash the site with plain soap and water. ? Pat the area dry with a clean towel. ? Do not rub the site. That could cause bleeding. 5. Do not apply powder or lotion to the site. Activity  1. For 24 hours after the procedure, or as directed by your health care provider: ? Do not flex or bend the affected arm. ? Do not push or pull heavy objects with the affected arm. ? Do not drive yourself home from the hospital or clinic. You may drive 24 hours after the procedure. ? Do not operate machinery or power tools. ? KEEP ARM ELEVATED THE REMAINDER OF THE DAY. 2. Do not push, pull or lift anything that is heavier than 10 lb for 5 days. 3. Ask your health care provider when it is okay to: ? Return to work or school. ? Resume usual physical activities or sports. ? Resume sexual  activity. General instructions  If the catheter site starts to bleed, raise your arm and put firm pressure on the site. If the bleeding does not stop, get help right away. This is a medical emergency.  DRINK PLENTY OF FLUIDS FOR THE NEXT 2-3 DAYS.  No alcohol consumption for 24 hours after receiving sedation.  If you went home on the same day as your procedure, a responsible adult should be with you for the first 24 hours after you arrive home.  Keep all follow-up visits as told by your health care provider. This is important. Contact a health care provider if:  You have a fever.  You have redness, swelling, or yellow drainage around your insertion site. Get help right away if:  You have unusual pain at the radial site.  The catheter insertion area swells very fast.  The insertion area is bleeding, and the bleeding does not stop when you hold steady pressure on the area.  Your arm or hand becomes pale, cool, tingly, or numb. These symptoms may represent a serious problem that is an emergency. Do not wait to see if the symptoms will go away. Get medical help right away. Call your local emergency services (911 in the [place] Do not drive yourself to the hospital. Summary  After the procedure, it is common to have bruising and tenderness at the site.  Follow instructions from your health care provider about how to take care   of your radial site wound. Check the wound every day for signs of infection.  This information is not intended to replace advice given to you by your health care provider. Make sure you discuss any questions you have with your health care provider. Document Revised: 11/22/2017 Document Reviewed: 11/22/2017 Elsevier Patient Education  2020 Elsevier Inc. 

## 2021-01-29 NOTE — Interval H&P Note (Signed)
Cath Lab Visit (complete for each Cath Lab visit)  Clinical Evaluation Leading to the Procedure:   ACS: No.  Non-ACS:    Anginal Classification: CCS II  Anti-ischemic medical therapy: Maximal Therapy (2 or more classes of medications)  Non-Invasive Test Results: Intermediate-risk stress test findings: cardiac mortality 1-3%/year  Prior CABG: No previous CABG      History and Physical Interval Note:  01/29/2021 7:38 AM  [name], [name]  has presented today for surgery, with the diagnosis of abnormal stress test.  The various methods of treatment have been discussed with the patient and family. After consideration of risks, benefits and other options for treatment, the patient has consented to  Procedure(s): LEFT HEART CATH AND CORONARY ANGIOGRAPHY (N/A) as a surgical intervention.  The patient's history has been reviewed, patient examined, no change in status, stable for surgery.  I have reviewed the patient's chart and labs.  Questions were answered to the patient's satisfaction.     Lance Muss

## 2021-01-29 NOTE — Progress Notes (Signed)
Discharge instructions reviewed with pt and his wife both voice understanding.  

## 2021-01-29 NOTE — Progress Notes (Signed)
Arm board applied to right arm  

## 2021-02-10 NOTE — Progress Notes (Signed)
Cardiology Office Note   Date:  02/11/2021   ID:  Brian Fries, Brian Savage, DOB 04/02/1951, MRN [id number]  PCP:  Kirby Funk, MD  Cardiologist:  Dr. Anne Fu    Chief Complaint  Patient presents with  . Hospitalization Follow-up   Dr. Izora Gala of Present Illness: Brian Scioneaux, Brian Savage is a 70 y.o. male who presents for post cath.    Has had coronary artery calcification/coronary artery disease aortic atherosclerosis and biventricular failure.  Previously hospitalized in June 2021 with shortness of breath.  He was diagnosed at that time with bilateral PE and right heart strain.  Echocardiogram showed EF of 40 to 45%.  Thankfully, repeat echocardiogram in 2021 showed normalization of his LV function.  At last visit we went over his coronary artery calcification/disease in his LAD and we checked a stress test.  EF was 42% on stress test with distal LAD territory ischemia.  Would like to proceed with angiogram.  He does state that he exercises on treadmill watches his heart rate closely.  He has not been experiencing any significant anginal symptoms.  The coronary calcification prompted the stress test.  He is going with his wife on a trip to [place].  We did discuss at length cautions.  He knows to seek medical attention if symptoms occur.  In an ideal situation, we would have angiogram performed prior.  I counseled him on not flying or leaving the country initially based upon results of stress testing.  In one aspect, since he is not having any significant anginal symptoms and the area of ischemia/possible infarction is fairly small on nuclear stress test, it is more on the intermediate risk range.  In other words, he does not seem to be in an unstable situation acutely.  Cardiac cath with non obstructive disease with 25% mLCx and mLAD. Normal EF  eliquis resumed the next day   Stable  He is doing well. We discussed his trip to [place].  Some in his party did test +  COVID. He has not.  No complaints today  Cath site healed.  We discussed diet and LDL goal of < 70  And importance of exercise.  Importance of diabetic control.       Past Medical History:  Diagnosis Date  . BPH (benign prostatic hyperplasia)   . Diabetes mellitus without complication (HCC)   . Essential hypertension     Past Surgical History:  Procedure Laterality Date  . CHOLECYSTECTOMY    . LEFT HEART CATH AND CORONARY ANGIOGRAPHY N/A 01/29/2021   Procedure: LEFT HEART CATH AND CORONARY ANGIOGRAPHY;  Surgeon: Corky Crafts, MD;  Location: [facility] INVASIVE CV LAB;  Service: Cardiovascular;  Laterality: N/A;  . ROTATOR CUFF REPAIR       Current Outpatient Medications  Medication Sig Dispense Refill  . ACCU-CHEK AVIVA PLUS test strip CHECK BLOOD SUGARS ALTERNATING BEFORE AND AFTER MEALS DAILY  5  . APIXABAN (ELIQUIS) VTE STARTER PACK (10MG  AND 5MG ) Take as directed on package: start with two-5mg  tablets twice daily for 7 days. On day 8, switch to one-5mg  tablet twice daily. 1 each 0  . cholecalciferol (VITAMIN D3) 25 MCG (1000 UNIT) tablet Take 2,000 Units by mouth daily.    glimepiride (AMARYL) 4 MG tablet Take 2 mg by mouth in the morning and at bedtime.   11  . Magnesium 400 MG CAPS Take 400 mg by mouth daily.    . metFORMIN (GLUCOPHAGE-XR) 500 MG 24 hr tablet Take 2 tablets (  1,000 mg total) by mouth 2 (two) times daily.    . Multiple Vitamin (MULTIVITAMIN) tablet Take 1 tablet by mouth daily.    . rosuvastatin (CRESTOR) 20 MG tablet Take 1 tablet (20 mg total) by mouth daily. 90 tablet 3  . telmisartan-hydrochlorothiazide (MICARDIS HCT) 80-25 MG per tablet Take 1 tablet by mouth daily.   11  . zinc gluconate 50 MG tablet Take 50 mg by mouth daily.    . metoprolol succinate (TOPROL-XL) 50 MG 24 hr tablet Take 1 tablet (50 mg total) by mouth daily. Take with or immediately following a meal. 30 tablet 0   No current facility-administered medications for this visit.    Allergies:    Patient has no known allergies.    Social History:  The patient  reports that he has never smoked. He has never used smokeless tobacco. He reports current alcohol use. He reports that he does not use drugs.   Family History:  The patient's family history includes Stroke in his father and mother.    ROS:  General:no colds or fevers, no weight changes Skin:no rashes or ulcers HEENT:no blurred vision, no congestion CV:see HPI PUL:see HPI GI:no diarrhea constipation or melena, no indigestion GU:no hematuria, no dysuria MS:no joint pain, no claudication Neuro:no syncope, no lightheadedness Endo:+ diabetes, no thyroid disease  Wt Readings from Last 3 Encounters:  02/11/21 231 lb (104.8 kg)  01/15/21 228 lb (103.4 kg)  01/08/21 232 lb (105.2 kg)     PHYSICAL EXAM: VS:  BP (!) 148/82   Pulse 70   Ht 6\' 2"  (1.88 m)   Wt 231 lb (104.8 kg)   SpO2 97%   BMI 29.66 kg/m  , BMI Body mass index is 29.66 kg/m. General:Pleasant affect, NAD Skin:Warm and dry, brisk capillary refill HEENT:normocephalic, sclera clear, mucus membranes moist Neck:supple, no JVD, no bruits  Heart:S1S2 RRR without murmur, gallup, rub or click Lungs:clear without rales, rhonchi, or wheezes , non tender, + BS, do not palpate liver spleen or masses Ext:no lower ext edema, 2+ pedal pulses, 2+ radial pulses Neuro:alert and oriented, MAE, follows commands, + facial symmetry    EKG:  EKG is NOT  ordered today.    Recent Labs: 04/13/2020: ALT 28; B Natriuretic Peptide 426.3 01/27/2021: BUN 16; Creatinine, Ser 0.93; Hemoglobin 13.4; Platelets 266; Potassium 4.3; Sodium 143    Lipid Panel No results found for: CHOL, TRIG, HDL, CHOLHDL, VLDL, LDLCALC, LDLDIRECT     Other studies Reviewed: Additional studies/ records that were reviewed today include: . 01/29/21 cardiac cath   Mid Cx lesion is 25% stenosed.  Mid LAD lesion is 25% stenosed.  The left ventricular systolic function is normal.  LV  end diastolic pressure is normal.  The left ventricular ejection fraction is 50-55% by visual estimate.  There is no aortic valve stenosis.   Coronary calcifications noted in the LAD and left circ.  Nonobstructive CAD.  COntinue preventive therapy.  Restart Eliquis tomorrow.  Restart Metformin on 01/31/21.   nuc study 01/08/21   The left ventricular ejection fraction is moderately decreased (30-44%).  Nuclear stress EF: 42%.  There was no ST segment deviation noted during stress.  Defect 1: There is a small defect of severe severity present in the apex location.  This is an intermediate risk study.  Findings consistent with prior myocardial infarction with peri-infarct ischemia.   Mild global hypokinesis. There is a small, severe defect at the apex at rest that is slightly worse with stress. Concerning  for infarct with peri-infarct ischemia.    ASSESSMENT AND PLAN:  1.  Mild CAD on cardiac cath.  And EF was normal. Cath site healed.  All reassuring.  Follow up in 1 year.  Reviewed cath and OV notes.  2.  HLD with goal LDL < 70. Per PCP    3.  Prior PE on eliquis   4. DM per PCP control important.   5.  HTN controlled. Continue toprol.    Current medicines are reviewed with the patient today.  The patient Has no concerns regarding medicines.  The following changes have been made:  See above Labs/ tests ordered today include:see above  Disposition:   FU:  see above  Signed, Nada Boozer, NP  02/11/2021 8:56 AM    [facility] [address] [place], [place], [place]  [address]/ [address] [place], [place] Phone: [phone]; Fax: [phone]  [phone]

## 2021-02-11 ENCOUNTER — Ambulatory Visit (INDEPENDENT_AMBULATORY_CARE_PROVIDER_SITE_OTHER): Payer: Medicare Other | Admitting: Cardiology

## 2021-02-11 ENCOUNTER — Other Ambulatory Visit: Payer: Self-pay

## 2021-02-11 ENCOUNTER — Encounter: Payer: Self-pay | Admitting: Cardiology

## 2021-02-11 VITALS — BP 148/82 | HR 70 | Ht 74.0 in | Wt 231.0 lb

## 2021-02-11 DIAGNOSIS — I2699 Other pulmonary embolism without acute cor pulmonale: Secondary | ICD-10-CM | POA: Diagnosis not present

## 2021-02-11 DIAGNOSIS — I10 Essential (primary) hypertension: Secondary | ICD-10-CM | POA: Diagnosis not present

## 2021-02-11 DIAGNOSIS — E785 Hyperlipidemia, unspecified: Secondary | ICD-10-CM

## 2021-02-11 DIAGNOSIS — I2510 Atherosclerotic heart disease of native coronary artery without angina pectoris: Secondary | ICD-10-CM

## 2021-02-11 DIAGNOSIS — E119 Type 2 diabetes mellitus without complications: Secondary | ICD-10-CM | POA: Diagnosis not present

## 2021-02-11 NOTE — Patient Instructions (Addendum)
Medication Instructions:  Your physician recommends that you continue on your current medications as directed. Please refer to the Current Medication list given to you today.  *If you need a refill on your cardiac medications before your next appointment, please call your pharmacy*   Lab Work: Your physician recommends that you continue on your current medications as directed. Please refer to the Current Medication list given to you today.  If you have labs (blood work) drawn today and your tests are completely normal, you will receive your results only by: Marland Kitchen MyChart Message (if you have MyChart) OR . A paper copy in the mail If you have any lab test that is abnormal or we need to change your treatment, we will call you to review the results.   Testing/Procedures: None ordered   Follow-Up: At [facility], you and your health needs are our priority.  As part of our continuing mission to provide you with exceptional heart care, we have created designated Provider Care Teams.  These Care Teams include your primary Cardiologist (physician) and Advanced Practice Providers (APPs -  Physician Assistants and Nurse Practitioners) who all work together to provide you with the care you need, when you need it.  We recommend signing up for the patient portal called "MyChart".  Sign up information is provided on this After Visit Summary.  MyChart is used to connect with patients for Virtual Visits (Telemedicine).  Patients are able to view lab/test results, encounter notes, upcoming appointments, etc.  Non-urgent messages can be sent to your provider as well.   To learn more about what you can do with MyChart, go to [email or web address].    Your next appointment:   1 Year   The format for your next appointment:   In Person  Provider:   Donato Schultz, MD   Other Instructions

## 2021-02-19 DIAGNOSIS — E1169 Type 2 diabetes mellitus with other specified complication: Secondary | ICD-10-CM | POA: Diagnosis not present

## 2021-02-19 DIAGNOSIS — I10 Essential (primary) hypertension: Secondary | ICD-10-CM | POA: Diagnosis not present

## 2021-02-19 DIAGNOSIS — I2510 Atherosclerotic heart disease of native coronary artery without angina pectoris: Secondary | ICD-10-CM | POA: Diagnosis not present

## 2021-02-19 DIAGNOSIS — N401 Enlarged prostate with lower urinary tract symptoms: Secondary | ICD-10-CM | POA: Diagnosis not present

## 2021-04-01 DIAGNOSIS — R972 Elevated prostate specific antigen [PSA]: Secondary | ICD-10-CM | POA: Diagnosis not present

## 2021-04-02 ENCOUNTER — Other Ambulatory Visit: Payer: Self-pay

## 2021-04-02 ENCOUNTER — Other Ambulatory Visit: Payer: Medicare Other | Admitting: *Deleted

## 2021-04-02 DIAGNOSIS — E785 Hyperlipidemia, unspecified: Secondary | ICD-10-CM | POA: Diagnosis not present

## 2021-04-02 LAB — LIPID PANEL
Chol/HDL Ratio: 2.6 ratio (ref 0.0–5.0)
Cholesterol, Total: 95 mg/dL — ABNORMAL LOW (ref 100–199)
HDL: 36 mg/dL — ABNORMAL LOW (ref >39)
LDL Chol Calc (NIH): 43 mg/dL (ref 0–99)
Triglycerides: 78 mg/dL (ref 0–149)
VLDL Cholesterol Cal: 16 mg/dL (ref 5–40)

## 2021-04-02 LAB — ALT: ALT: 19 IU/L (ref 0–44)

## 2021-04-07 DIAGNOSIS — R972 Elevated prostate specific antigen [PSA]: Secondary | ICD-10-CM | POA: Diagnosis not present

## 2021-04-07 DIAGNOSIS — R351 Nocturia: Secondary | ICD-10-CM | POA: Diagnosis not present

## 2021-04-07 DIAGNOSIS — N401 Enlarged prostate with lower urinary tract symptoms: Secondary | ICD-10-CM | POA: Diagnosis not present

## 2021-04-08 ENCOUNTER — Encounter: Payer: Self-pay | Admitting: *Deleted

## 2021-06-21 DIAGNOSIS — M79671 Pain in right foot: Secondary | ICD-10-CM | POA: Diagnosis not present

## 2021-06-21 DIAGNOSIS — G245 Blepharospasm: Secondary | ICD-10-CM | POA: Diagnosis not present

## 2021-06-21 DIAGNOSIS — I10 Essential (primary) hypertension: Secondary | ICD-10-CM | POA: Diagnosis not present

## 2021-06-21 DIAGNOSIS — E1169 Type 2 diabetes mellitus with other specified complication: Secondary | ICD-10-CM | POA: Diagnosis not present

## 2021-06-21 DIAGNOSIS — N401 Enlarged prostate with lower urinary tract symptoms: Secondary | ICD-10-CM | POA: Diagnosis not present

## 2021-07-16 ENCOUNTER — Ambulatory Visit: Payer: TRICARE For Life (TFL) | Admitting: Cardiology

## 2021-08-26 DIAGNOSIS — H2513 Age-related nuclear cataract, bilateral: Secondary | ICD-10-CM | POA: Diagnosis not present

## 2021-08-26 DIAGNOSIS — H5213 Myopia, bilateral: Secondary | ICD-10-CM | POA: Diagnosis not present

## 2021-08-26 DIAGNOSIS — E119 Type 2 diabetes mellitus without complications: Secondary | ICD-10-CM | POA: Diagnosis not present

## 2021-10-06 DIAGNOSIS — I2510 Atherosclerotic heart disease of native coronary artery without angina pectoris: Secondary | ICD-10-CM | POA: Diagnosis not present

## 2021-10-06 DIAGNOSIS — E1169 Type 2 diabetes mellitus with other specified complication: Secondary | ICD-10-CM | POA: Diagnosis not present

## 2021-10-06 DIAGNOSIS — N401 Enlarged prostate with lower urinary tract symptoms: Secondary | ICD-10-CM | POA: Diagnosis not present

## 2021-10-06 DIAGNOSIS — I2584 Coronary atherosclerosis due to calcified coronary lesion: Secondary | ICD-10-CM | POA: Diagnosis not present

## 2021-10-06 DIAGNOSIS — E785 Hyperlipidemia, unspecified: Secondary | ICD-10-CM | POA: Diagnosis not present

## 2021-10-06 DIAGNOSIS — I10 Essential (primary) hypertension: Secondary | ICD-10-CM | POA: Diagnosis not present

## 2021-10-07 DIAGNOSIS — R972 Elevated prostate specific antigen [PSA]: Secondary | ICD-10-CM | POA: Diagnosis not present

## 2022-01-05 ENCOUNTER — Other Ambulatory Visit: Payer: Self-pay | Admitting: Cardiology

## 2022-01-07 DIAGNOSIS — R825 Elevated urine levels of drugs, medicaments and biological substances: Secondary | ICD-10-CM | POA: Diagnosis not present

## 2022-01-07 DIAGNOSIS — R3914 Feeling of incomplete bladder emptying: Secondary | ICD-10-CM | POA: Diagnosis not present

## 2022-01-07 DIAGNOSIS — N401 Enlarged prostate with lower urinary tract symptoms: Secondary | ICD-10-CM | POA: Diagnosis not present

## 2022-03-02 DIAGNOSIS — N401 Enlarged prostate with lower urinary tract symptoms: Secondary | ICD-10-CM | POA: Diagnosis not present

## 2022-03-02 DIAGNOSIS — E1169 Type 2 diabetes mellitus with other specified complication: Secondary | ICD-10-CM | POA: Diagnosis not present

## 2022-03-02 DIAGNOSIS — E785 Hyperlipidemia, unspecified: Secondary | ICD-10-CM | POA: Diagnosis not present

## 2022-03-02 DIAGNOSIS — I10 Essential (primary) hypertension: Secondary | ICD-10-CM | POA: Diagnosis not present

## 2022-03-10 DIAGNOSIS — N3000 Acute cystitis without hematuria: Secondary | ICD-10-CM | POA: Diagnosis not present

## 2022-03-14 ENCOUNTER — Other Ambulatory Visit: Payer: Self-pay | Admitting: Cardiology

## 2022-04-13 DIAGNOSIS — R972 Elevated prostate specific antigen [PSA]: Secondary | ICD-10-CM | POA: Diagnosis not present

## 2022-04-20 DIAGNOSIS — R825 Elevated urine levels of drugs, medicaments and biological substances: Secondary | ICD-10-CM | POA: Diagnosis not present

## 2022-04-20 DIAGNOSIS — R972 Elevated prostate specific antigen [PSA]: Secondary | ICD-10-CM | POA: Diagnosis not present

## 2022-04-20 DIAGNOSIS — N401 Enlarged prostate with lower urinary tract symptoms: Secondary | ICD-10-CM | POA: Diagnosis not present

## 2022-04-20 DIAGNOSIS — R3914 Feeling of incomplete bladder emptying: Secondary | ICD-10-CM | POA: Diagnosis not present

## 2022-04-22 ENCOUNTER — Other Ambulatory Visit: Payer: Self-pay | Admitting: Cardiology

## 2022-05-04 ENCOUNTER — Other Ambulatory Visit: Payer: Self-pay | Admitting: Cardiology

## 2022-05-05 ENCOUNTER — Other Ambulatory Visit: Payer: Self-pay | Admitting: Cardiology

## 2022-05-19 ENCOUNTER — Other Ambulatory Visit: Payer: Self-pay | Admitting: Cardiology

## 2022-05-29 ENCOUNTER — Other Ambulatory Visit: Payer: Self-pay | Admitting: Cardiology

## 2022-06-01 DIAGNOSIS — I10 Essential (primary) hypertension: Secondary | ICD-10-CM | POA: Diagnosis not present

## 2022-06-01 DIAGNOSIS — E785 Hyperlipidemia, unspecified: Secondary | ICD-10-CM | POA: Diagnosis not present

## 2022-06-01 DIAGNOSIS — E119 Type 2 diabetes mellitus without complications: Secondary | ICD-10-CM | POA: Diagnosis not present

## 2022-06-01 DIAGNOSIS — Z7985 Long-term (current) use of injectable non-insulin antidiabetic drugs: Secondary | ICD-10-CM | POA: Diagnosis not present

## 2022-06-01 DIAGNOSIS — Z86711 Personal history of pulmonary embolism: Secondary | ICD-10-CM | POA: Diagnosis not present

## 2022-06-01 DIAGNOSIS — N400 Enlarged prostate without lower urinary tract symptoms: Secondary | ICD-10-CM | POA: Diagnosis not present

## 2022-06-01 DIAGNOSIS — Z7901 Long term (current) use of anticoagulants: Secondary | ICD-10-CM | POA: Diagnosis not present

## 2022-06-01 DIAGNOSIS — Z7984 Long term (current) use of oral hypoglycemic drugs: Secondary | ICD-10-CM | POA: Diagnosis not present

## 2022-06-14 ENCOUNTER — Other Ambulatory Visit: Payer: Self-pay | Admitting: Cardiology

## 2022-07-22 DIAGNOSIS — N401 Enlarged prostate with lower urinary tract symptoms: Secondary | ICD-10-CM | POA: Diagnosis not present

## 2022-07-22 DIAGNOSIS — I10 Essential (primary) hypertension: Secondary | ICD-10-CM | POA: Diagnosis not present

## 2022-07-22 DIAGNOSIS — I2584 Coronary atherosclerosis due to calcified coronary lesion: Secondary | ICD-10-CM | POA: Diagnosis not present

## 2022-07-22 DIAGNOSIS — Z86711 Personal history of pulmonary embolism: Secondary | ICD-10-CM | POA: Diagnosis not present

## 2022-07-22 DIAGNOSIS — E1169 Type 2 diabetes mellitus with other specified complication: Secondary | ICD-10-CM | POA: Diagnosis not present

## 2022-08-08 DIAGNOSIS — E669 Obesity, unspecified: Secondary | ICD-10-CM | POA: Diagnosis not present

## 2022-08-08 DIAGNOSIS — E1169 Type 2 diabetes mellitus with other specified complication: Secondary | ICD-10-CM | POA: Diagnosis not present

## 2022-08-08 DIAGNOSIS — I10 Essential (primary) hypertension: Secondary | ICD-10-CM | POA: Diagnosis not present

## 2022-09-30 DIAGNOSIS — H52203 Unspecified astigmatism, bilateral: Secondary | ICD-10-CM | POA: Diagnosis not present

## 2022-09-30 DIAGNOSIS — E119 Type 2 diabetes mellitus without complications: Secondary | ICD-10-CM | POA: Diagnosis not present

## 2023-01-12 DIAGNOSIS — R825 Elevated urine levels of drugs, medicaments and biological substances: Secondary | ICD-10-CM | POA: Diagnosis not present

## 2023-02-07 ENCOUNTER — Other Ambulatory Visit: Payer: Self-pay | Admitting: Cardiology

## 2023-02-16 ENCOUNTER — Other Ambulatory Visit: Payer: Self-pay | Admitting: Cardiology

## 2023-03-11 ENCOUNTER — Other Ambulatory Visit: Payer: Self-pay | Admitting: Cardiology

## 2023-03-14 ENCOUNTER — Telehealth: Payer: Self-pay | Admitting: Cardiology

## 2023-03-14 NOTE — Telephone Encounter (Signed)
Pt not seen here since 01/2021.  Unsure of what medication he is currently taking.  Advised [facility]

## 2023-03-14 NOTE — Telephone Encounter (Signed)
Pt c/o medication issue:  1. Name of Medication:   rosuvastatin (CRESTOR) 20 MG tablet   2. How are you currently taking this medication (dosage and times per day)?   3. Are you having a reaction (difficulty breathing--STAT)?   4. What is your medication issue?   Caller wants to confirm if the patient is still taking this medication.

## 2023-05-24 ENCOUNTER — Other Ambulatory Visit: Payer: Self-pay | Admitting: Cardiology

## 2023-05-25 NOTE — Telephone Encounter (Signed)
Patient has requested rosuvastatin calcium 20 mg. Patient has already had third refill request filled. Last since 02/11/2021 and doesn't have an appointment scheduled.

## 2023-05-26 DIAGNOSIS — E669 Obesity, unspecified: Secondary | ICD-10-CM | POA: Diagnosis not present

## 2023-05-26 DIAGNOSIS — I700 Atherosclerosis of aorta: Secondary | ICD-10-CM | POA: Diagnosis not present

## 2023-05-26 DIAGNOSIS — I10 Essential (primary) hypertension: Secondary | ICD-10-CM | POA: Diagnosis not present

## 2023-05-26 DIAGNOSIS — E1169 Type 2 diabetes mellitus with other specified complication: Secondary | ICD-10-CM | POA: Diagnosis not present

## 2023-05-26 DIAGNOSIS — I2584 Coronary atherosclerosis due to calcified coronary lesion: Secondary | ICD-10-CM | POA: Diagnosis not present

## 2023-05-26 DIAGNOSIS — I428 Other cardiomyopathies: Secondary | ICD-10-CM | POA: Diagnosis not present

## 2023-05-26 DIAGNOSIS — D6869 Other thrombophilia: Secondary | ICD-10-CM | POA: Diagnosis not present

## 2023-05-26 DIAGNOSIS — E119 Type 2 diabetes mellitus without complications: Secondary | ICD-10-CM | POA: Diagnosis not present

## 2023-05-26 DIAGNOSIS — Z86711 Personal history of pulmonary embolism: Secondary | ICD-10-CM | POA: Diagnosis not present

## 2023-05-26 DIAGNOSIS — E785 Hyperlipidemia, unspecified: Secondary | ICD-10-CM | POA: Diagnosis not present

## 2023-07-21 DIAGNOSIS — Z23 Encounter for immunization: Secondary | ICD-10-CM | POA: Diagnosis not present

## 2023-08-03 ENCOUNTER — Ambulatory Visit: Payer: Medicare PPO | Attending: Cardiology | Admitting: Cardiology

## 2023-08-03 ENCOUNTER — Encounter: Payer: Self-pay | Admitting: Cardiology

## 2023-08-03 VITALS — BP 120/78 | HR 83 | Ht 74.0 in | Wt 219.4 lb

## 2023-08-03 DIAGNOSIS — R9439 Abnormal result of other cardiovascular function study: Secondary | ICD-10-CM | POA: Diagnosis not present

## 2023-08-03 DIAGNOSIS — I10 Essential (primary) hypertension: Secondary | ICD-10-CM | POA: Diagnosis not present

## 2023-08-03 DIAGNOSIS — E119 Type 2 diabetes mellitus without complications: Secondary | ICD-10-CM | POA: Diagnosis not present

## 2023-08-03 DIAGNOSIS — I2699 Other pulmonary embolism without acute cor pulmonale: Secondary | ICD-10-CM | POA: Diagnosis not present

## 2023-08-03 MED ORDER — ROSUVASTATIN CALCIUM 10 MG PO TABS: 10.0000 mg | ORAL_TABLET | Freq: Every day | ORAL | 3 refills | Status: DC

## 2023-08-03 NOTE — Patient Instructions (Signed)
Medication Instructions:  Please decrease Crestor to 10 mg daily. Continue all other medications as listed.  *If you need a refill on your cardiac medications before your next appointment, please call your pharmacy*  Follow-Up: At [facility], you and your health needs are our priority.  As part of our continuing mission to provide you with exceptional heart care, we have created designated Provider Care Teams.  These Care Teams include your primary Cardiologist (physician) and Advanced Practice Providers (APPs -  Physician Assistants and Nurse Practitioners) who all work together to provide you with the care you need, when you need it.  We recommend signing up for the patient portal called "MyChart".  Sign up information is provided on this After Visit Summary.  MyChart is used to connect with patients for Virtual Visits (Telemedicine).  Patients are able to view lab/test results, encounter notes, upcoming appointments, etc.  Non-urgent messages can be sent to your provider as well.   To learn more about what you can do with MyChart, go to [email or web address].    Your next appointment:   1 year(s)  Provider:   Donato Schultz, MD

## 2023-08-03 NOTE — Progress Notes (Signed)
Cardiology Office Note:  .   Date:  08/03/2023  ID:  Brian Fries, Brian Savage, DOB [birth year], MRN [id number] PCP: Emilio Aspen, MD  [facility] Providers Cardiologist:  Donato Schultz, MD     History of Present Illness: .   Brian Fries, Brian Savage is a 72 y.o. male Discussed with the use of AI scribe software  History of Present Illness   The patient is a 72 year old male with a history of nonobstructive coronary artery disease, diabetes, hypertension, prior pulmonary embolism, and hyperlipidemia. The coronary artery disease was diagnosed following a nuclear stress test that indicated possible anterior wall ischemia, leading to a coronary catheterization performed on 11/21/2020. The patient's diabetes is managed by his primary care physician, and his hypertension is well controlled on Toprol. He remains on Eliquis for a prior pulmonary embolism and takes Crestor 20mg  daily for hyperlipidemia, with an LDL less than 70. The patient also takes telmisartan and chlorothiazide 80/25mg  daily.  In June 2021, an echocardiogram revealed an ejection fraction (EF) of 40-45% at the time of diagnosis of bilateral pulmonary embolism with right heart strain. However, a repeat echocardiogram showed an improvement in EF. The patient has been asymptomatic and has not reported any new cardiac symptoms. He has been maintaining an active lifestyle, including international travel.  The patient has been off Crestor for a while, thinking it might not be necessary anymore. However, he is open to resuming it at a lower dose for maintenance therapy. 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 He remains active in his profession and enjoys taking his students on practical learning experiences.            Studies Reviewed: Marland Kitchen   EKG Interpretation Date/Time:  Thursday August 03 2023 14:19:05 EDT Ventricular  Rate:  83 PR Interval:  170 QRS Duration:  94 QT Interval:  384 QTC Calculation: 451 R Axis:   -24  Text Interpretation: Normal sinus rhythm Normal ECG When compared with ECG of 13-Apr-2020 05:52, No significant change since last tracing Confirmed by Donato Schultz ([id number]) on 08/03/2023 2:21:22 PM    Results LABS LDL: 34 (2023)  DIAGNOSTIC Coronary catheterization: Nonobstructive disease (01/29/2021) Nuclear stress test: Possible anterior wall ischemia Echocardiogram: EF 40-45% (03/2020) Echocardiogram: EF improved  Risk Assessment/Calculations:            Physical Exam:   VS:  BP 120/78   Pulse 83   Ht 6\' 2"  (1.88 m)   Wt 219 lb 6.4 oz (99.5 kg)   SpO2 95%   BMI 28.17 kg/m    Wt Readings from Last 3 Encounters:  08/03/23 219 lb 6.4 oz (99.5 kg)  02/11/21 231 lb (104.8 kg)  01/15/21 228 lb (103.4 kg)    GEN: Well nourished, well developed in no acute distress NECK: No JVD; No carotid bruits CARDIAC: RRR, no murmurs, no rubs, no gallops RESPIRATORY:  Clear to auscultation without rales, wheezing or rhonchi  ABDOMEN: Soft, non-tender, non-distended EXTREMITIES:  No edema; No deformity   ASSESSMENT AND PLAN: .    Assessment and Plan    Coronary Artery Disease Nonobstructive disease on coronary catheterization performed in 2022. No new symptoms reported. -Continue current management.  Hyperlipidemia LDL less than 70 on Crestor 20mg  daily. Patient has been off medication for a while. -Resume Crestor at a lower dose of 10mg  daily for maintenance therapy.  Hypertension Well controlled on Toprol and Telmisartan/Chlorothiazide 80/25mg  daily. -Continue  current management.  History of Pulmonary Embolism Currently on Eliquis. -Continue current management.  Diabetes Managed by primary care physician. -No changes recommended at this time.  Follow-up -Schedule annual follow-up with EKG.               Signed, Donato Schultz, MD

## 2023-08-31 DIAGNOSIS — E1169 Type 2 diabetes mellitus with other specified complication: Secondary | ICD-10-CM | POA: Diagnosis not present

## 2023-10-03 DIAGNOSIS — H5213 Myopia, bilateral: Secondary | ICD-10-CM | POA: Diagnosis not present

## 2023-10-03 DIAGNOSIS — E119 Type 2 diabetes mellitus without complications: Secondary | ICD-10-CM | POA: Diagnosis not present

## 2023-10-03 DIAGNOSIS — H2513 Age-related nuclear cataract, bilateral: Secondary | ICD-10-CM | POA: Diagnosis not present

## 2023-11-23 DIAGNOSIS — Z1331 Encounter for screening for depression: Secondary | ICD-10-CM | POA: Diagnosis not present

## 2023-11-23 DIAGNOSIS — D6869 Other thrombophilia: Secondary | ICD-10-CM | POA: Diagnosis not present

## 2023-11-23 DIAGNOSIS — E669 Obesity, unspecified: Secondary | ICD-10-CM | POA: Diagnosis not present

## 2023-11-23 DIAGNOSIS — E1169 Type 2 diabetes mellitus with other specified complication: Secondary | ICD-10-CM | POA: Diagnosis not present

## 2023-11-23 DIAGNOSIS — I10 Essential (primary) hypertension: Secondary | ICD-10-CM | POA: Diagnosis not present

## 2023-11-23 DIAGNOSIS — I428 Other cardiomyopathies: Secondary | ICD-10-CM | POA: Diagnosis not present

## 2023-11-23 DIAGNOSIS — I700 Atherosclerosis of aorta: Secondary | ICD-10-CM | POA: Diagnosis not present

## 2023-11-23 DIAGNOSIS — E785 Hyperlipidemia, unspecified: Secondary | ICD-10-CM | POA: Diagnosis not present

## 2023-11-23 DIAGNOSIS — Z0000 Encounter for general adult medical examination without abnormal findings: Secondary | ICD-10-CM | POA: Diagnosis not present

## 2023-11-23 DIAGNOSIS — Z86711 Personal history of pulmonary embolism: Secondary | ICD-10-CM | POA: Diagnosis not present

## 2023-11-23 DIAGNOSIS — I2584 Coronary atherosclerosis due to calcified coronary lesion: Secondary | ICD-10-CM | POA: Diagnosis not present

## 2023-11-27 DIAGNOSIS — R972 Elevated prostate specific antigen [PSA]: Secondary | ICD-10-CM | POA: Diagnosis not present

## 2023-11-27 DIAGNOSIS — N401 Enlarged prostate with lower urinary tract symptoms: Secondary | ICD-10-CM | POA: Diagnosis not present

## 2023-11-27 DIAGNOSIS — R3914 Feeling of incomplete bladder emptying: Secondary | ICD-10-CM | POA: Diagnosis not present

## 2023-12-25 DIAGNOSIS — E785 Hyperlipidemia, unspecified: Secondary | ICD-10-CM | POA: Diagnosis not present

## 2023-12-25 DIAGNOSIS — E1169 Type 2 diabetes mellitus with other specified complication: Secondary | ICD-10-CM | POA: Diagnosis not present

## 2023-12-25 DIAGNOSIS — E559 Vitamin D deficiency, unspecified: Secondary | ICD-10-CM | POA: Diagnosis not present

## 2023-12-25 DIAGNOSIS — I2584 Coronary atherosclerosis due to calcified coronary lesion: Secondary | ICD-10-CM | POA: Diagnosis not present

## 2024-01-03 DIAGNOSIS — E119 Type 2 diabetes mellitus without complications: Secondary | ICD-10-CM | POA: Diagnosis not present

## 2024-02-27 DIAGNOSIS — I4891 Unspecified atrial fibrillation: Secondary | ICD-10-CM | POA: Diagnosis not present

## 2024-02-27 DIAGNOSIS — I10 Essential (primary) hypertension: Secondary | ICD-10-CM | POA: Diagnosis not present

## 2024-02-27 DIAGNOSIS — I700 Atherosclerosis of aorta: Secondary | ICD-10-CM | POA: Diagnosis not present

## 2024-02-27 DIAGNOSIS — Z8249 Family history of ischemic heart disease and other diseases of the circulatory system: Secondary | ICD-10-CM | POA: Diagnosis not present

## 2024-02-27 DIAGNOSIS — E119 Type 2 diabetes mellitus without complications: Secondary | ICD-10-CM | POA: Diagnosis not present

## 2024-02-27 DIAGNOSIS — I2510 Atherosclerotic heart disease of native coronary artery without angina pectoris: Secondary | ICD-10-CM | POA: Diagnosis not present

## 2024-02-27 DIAGNOSIS — Z7901 Long term (current) use of anticoagulants: Secondary | ICD-10-CM | POA: Diagnosis not present

## 2024-02-27 DIAGNOSIS — I429 Cardiomyopathy, unspecified: Secondary | ICD-10-CM | POA: Diagnosis not present

## 2024-02-27 DIAGNOSIS — D6869 Other thrombophilia: Secondary | ICD-10-CM | POA: Diagnosis not present

## 2024-05-21 DIAGNOSIS — R972 Elevated prostate specific antigen [PSA]: Secondary | ICD-10-CM | POA: Diagnosis not present

## 2024-05-23 DIAGNOSIS — I10 Essential (primary) hypertension: Secondary | ICD-10-CM | POA: Diagnosis not present

## 2024-05-23 DIAGNOSIS — D6869 Other thrombophilia: Secondary | ICD-10-CM | POA: Diagnosis not present

## 2024-05-23 DIAGNOSIS — E1169 Type 2 diabetes mellitus with other specified complication: Secondary | ICD-10-CM | POA: Diagnosis not present

## 2024-05-23 DIAGNOSIS — I700 Atherosclerosis of aorta: Secondary | ICD-10-CM | POA: Diagnosis not present

## 2024-05-23 DIAGNOSIS — Z86711 Personal history of pulmonary embolism: Secondary | ICD-10-CM | POA: Diagnosis not present

## 2024-05-23 DIAGNOSIS — I2584 Coronary atherosclerosis due to calcified coronary lesion: Secondary | ICD-10-CM | POA: Diagnosis not present

## 2024-06-12 ENCOUNTER — Telehealth: Payer: Self-pay | Admitting: Pharmacist

## 2024-06-12 NOTE — Progress Notes (Signed)
   06/12/2024  Patient ID: [name], [name], male   DOB: Mar 02, 1951, 73 y.o.   MRN: [id number]  Patient was identified for DM counseling based on A1c greater than 8% on last lab results.   Tried calling patient today to schedule free DM initial visit me as the pharmacist, with all changes made in collaboration with the PCP. Left HIPAA compliant voicemail requesting call back at earliest convenience to schedule. If patient returns call to office, you can transfer them to my line at [phone]. Thank you!  Attempt #1 (out of 3)  Called to ask about new Jardiance medication and current BG readings.Will try again in 1 week.     Aloysius Lewis, PharmD [facility]  Phone Number: [phone]

## 2024-07-03 ENCOUNTER — Other Ambulatory Visit: Payer: Self-pay | Admitting: Urology

## 2024-07-03 DIAGNOSIS — R972 Elevated prostate specific antigen [PSA]: Secondary | ICD-10-CM | POA: Diagnosis not present

## 2024-07-03 DIAGNOSIS — R3914 Feeling of incomplete bladder emptying: Secondary | ICD-10-CM | POA: Diagnosis not present

## 2024-07-03 DIAGNOSIS — N401 Enlarged prostate with lower urinary tract symptoms: Secondary | ICD-10-CM | POA: Diagnosis not present

## 2024-07-05 ENCOUNTER — Encounter: Payer: Self-pay | Admitting: Urology

## 2024-07-16 ENCOUNTER — Other Ambulatory Visit

## 2024-07-24 ENCOUNTER — Other Ambulatory Visit: Payer: Self-pay | Admitting: Cardiology

## 2024-07-25 ENCOUNTER — Other Ambulatory Visit

## 2024-07-29 DIAGNOSIS — R3914 Feeling of incomplete bladder emptying: Secondary | ICD-10-CM | POA: Diagnosis not present

## 2024-07-29 DIAGNOSIS — N3000 Acute cystitis without hematuria: Secondary | ICD-10-CM | POA: Diagnosis not present

## 2024-07-30 ENCOUNTER — Ambulatory Visit
Admission: RE | Admit: 2024-07-30 | Discharge: 2024-07-30 | Disposition: A | Discharge status: Home / Self Care | Source: Ambulatory Visit | Attending: Urology | Admitting: Urology

## 2024-07-30 DIAGNOSIS — R972 Elevated prostate specific antigen [PSA]: Secondary | ICD-10-CM

## 2024-07-30 MED ORDER — GADOPICLENOL 0.5 MMOL/ML IV SOLN
10.0000 mL | Freq: Once | INTRAVENOUS | Status: AC | PRN
  Administered 2024-07-30: 10 mL via INTRAVENOUS

## 2024-08-02 DIAGNOSIS — R972 Elevated prostate specific antigen [PSA]: Secondary | ICD-10-CM | POA: Diagnosis not present

## 2024-08-06 ENCOUNTER — Encounter: Payer: Self-pay | Admitting: Cardiology

## 2024-08-06 ENCOUNTER — Ambulatory Visit: Attending: Cardiology | Admitting: Cardiology

## 2024-08-06 VITALS — BP 145/79 | HR 80 | Ht 74.0 in | Wt 215.0 lb

## 2024-08-06 DIAGNOSIS — I10 Essential (primary) hypertension: Secondary | ICD-10-CM | POA: Diagnosis not present

## 2024-08-06 DIAGNOSIS — I2699 Other pulmonary embolism without acute cor pulmonale: Secondary | ICD-10-CM | POA: Diagnosis not present

## 2024-08-06 DIAGNOSIS — E119 Type 2 diabetes mellitus without complications: Secondary | ICD-10-CM | POA: Diagnosis not present

## 2024-08-06 DIAGNOSIS — I2510 Atherosclerotic heart disease of native coronary artery without angina pectoris: Secondary | ICD-10-CM

## 2024-08-06 NOTE — Patient Instructions (Signed)

## 2024-08-06 NOTE — Progress Notes (Signed)
 Cardiology Office Note:  .   Date:  08/06/2024  ID:  Brian Savage, DOB Nov 02, 1950, MRN [id number] PCP: Charlott Dorn LABOR, MD  [facility] Providers Cardiologist:  Oneil Parchment, MD     History of Present Illness: .   Brian Savage is a 73 y.o. male Discussed the use of AI scribe software  History of Present Illness Brian Savage is a 73 year old male with nonobstructive coronary artery disease who presents for follow-up.  He has a history of nonobstructive coronary artery disease, identified through a cardiac catheterization in January 2022 following a nuclear stress test that indicated possible anterior wall ischemia. He is on Crestor  10 mg daily, which was resumed at the last visit. His LDL cholesterol was 34 mg/dL as of February 7974.  He has a history of pulmonary embolism and is on Eliquis  5 mg twice daily, with no new symptoms reported. His anticoagulation therapy continues as prescribed.  He has diabetes and hypertension. His diabetes is managed with metformin  1000 mg twice daily, Trulicity, and Jardiance, which have helped lower his blood sugar levels to the 130s-140s. His A1c was 8.3%, indicating suboptimal control. For hypertension, he takes Toprol , telmisartan , and hydrochlorothiazide  80/25 mg daily.  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 He enjoys teaching seniors and freshmen, noting that sophomores and juniors can be more challenging. He enjoys traveling during the spring and summer, with plans for cruises and trips to [place].      Studies Reviewed: SABRA   EKG Interpretation Date/Time:  Tuesday August 06 2024 08:43:52 EDT Ventricular Rate:  80 PR Interval:  176 QRS Duration:  92 QT Interval:  368 QTC Calculation: 424 R Axis:   16  Text Interpretation: Normal sinus rhythm Normal  ECG When compared with ECG of 03-Aug-2023 14:19, No significant change was found Confirmed by Parchment Oneil ([id number]) on 08/06/2024 8:52:21 AM    Results LABS LDL: 34 (12/25/2023) A1c: 8.3 Hemoglobin: 13.9 Creatinine: 0.5 ALT: 18  DIAGNOSTIC Echocardiogram: Ejection fraction 45-50% (2021) Nuclear stress test: Possible anterior wall ischemia (2022) Cardiac catheterization: Nonobstructive coronary artery disease (11/21/2020) Electrocardiogram: Normal Risk Assessment/Calculations:           Physical Exam:   VS:  BP (!) 145/79   Pulse 80   Ht 6' 2 (1.88 m)   Wt 215 lb (97.5 kg)   SpO2 97%   BMI 27.60 kg/m    Wt Readings from Last 3 Encounters:  08/06/24 215 lb (97.5 kg)  08/03/23 219 lb 6.4 oz (99.5 kg)  02/11/21 231 lb (104.8 kg)    GEN: Well nourished, well developed in no acute distress NECK: No JVD; No carotid bruits CARDIAC: RRR, no murmurs, no rubs, no gallops RESPIRATORY:  Clear to auscultation without rales, wheezing or rhonchi  ABDOMEN: Soft, non-tender, non-distended EXTREMITIES:  No edema; No deformity   ASSESSMENT AND PLAN: .    Assessment and Plan Assessment & Plan Nonobstructive coronary artery disease Previous cardiac catheterization in 2022 showed nonobstructive disease. Current management with Crestor  10 mg has lowered LDL to 34 as of February 2025, providing cardiovascular protection. No new symptoms reported, and EKG shows no changes. - Continue Crestor  10 mg daily. - Monitor for new symptoms such as exertional discomfort. If symptoms arise, consider CT scan for further investigation.  Hyperlipidemia Hyperlipidemia is well-controlled with Crestor  10 mg,  achieving an LDL of 34, within the target range for cardiovascular protection. - Continue Crestor  10 mg daily.  Type 2 diabetes mellitus Recent A1c of 8.3 indicates suboptimal control. Current management includes metformin  1000 mg twice daily, Trulicity, and Jardiance, with improved blood glucose levels  to 130s-140s. Emphasis on diet management for glucose control. - Continue metformin  1000 mg twice daily. - Continue Trulicity as prescribed. - Continue Jardiance as prescribed. - Emphasize dietary management for better glucose control.  Hypertension Hypertension managed with telmisartan /hydrochlorothiazide  80/25 mg daily and metoprolol  50 mg. Current regimen is effective with no reported issues. - Continue telmisartan /hydrochlorothiazide  80/25 mg daily. - Continue metoprolol  50 mg daily.  Pulmonary embolism on anticoagulation Managed with Eliquis  5 mg twice daily. - Continue Eliquis  5 mg twice daily. Chronic therapy         Dispo: 1 yr  Signed, Oneil Parchment, MD

## 2024-08-28 DIAGNOSIS — R3912 Poor urinary stream: Secondary | ICD-10-CM | POA: Diagnosis not present

## 2024-08-28 DIAGNOSIS — R351 Nocturia: Secondary | ICD-10-CM | POA: Diagnosis not present

## 2024-08-28 DIAGNOSIS — R972 Elevated prostate specific antigen [PSA]: Secondary | ICD-10-CM | POA: Diagnosis not present

## 2024-08-28 DIAGNOSIS — R399 Unspecified symptoms and signs involving the genitourinary system: Secondary | ICD-10-CM | POA: Diagnosis not present

## 2024-08-28 DIAGNOSIS — N401 Enlarged prostate with lower urinary tract symptoms: Secondary | ICD-10-CM | POA: Diagnosis not present

## 2024-09-05 DIAGNOSIS — R972 Elevated prostate specific antigen [PSA]: Secondary | ICD-10-CM | POA: Diagnosis not present

## 2024-09-13 DIAGNOSIS — N401 Enlarged prostate with lower urinary tract symptoms: Secondary | ICD-10-CM | POA: Diagnosis not present

## 2024-09-13 DIAGNOSIS — R972 Elevated prostate specific antigen [PSA]: Secondary | ICD-10-CM | POA: Diagnosis not present

## 2024-09-17 ENCOUNTER — Telehealth: Payer: Self-pay | Admitting: Cardiology

## 2024-09-17 NOTE — Telephone Encounter (Signed)
   Pre-operative Risk Assessment    Patient Name: Brian Fultz, [name]  DOB: [birth year] MRN: [id number]      Request for Surgical Clearance    Procedure:  Prostate Biopsy   Date of Surgery:  Clearance 10/22/24                                 Surgeon:  Dr. Shane Surgeon's Group or Practice Name:  [facility]  Phone number:  [phone] Fax number:  [phone]   Type of Clearance Requested:   - Medical  - Pharmacy:  Hold Apixaban  (Eliquis ) def to cards    Type of Anesthesia:  General    Additional requests/questions:    SignedRojelio Kays   09/17/2024, 10:16 AM

## 2024-09-17 NOTE — Telephone Encounter (Signed)
 Dr. Jeffrie You saw this patient 08/06/24. Can you please comment on preoperative risk prior to prostate biopsy?  Also sending to pharmD for eliquis  hold.

## 2024-09-23 DIAGNOSIS — I2584 Coronary atherosclerosis due to calcified coronary lesion: Secondary | ICD-10-CM | POA: Diagnosis not present

## 2024-09-23 DIAGNOSIS — I429 Cardiomyopathy, unspecified: Secondary | ICD-10-CM | POA: Insufficient documentation

## 2024-09-23 DIAGNOSIS — Z86711 Personal history of pulmonary embolism: Secondary | ICD-10-CM | POA: Insufficient documentation

## 2024-09-23 DIAGNOSIS — E1169 Type 2 diabetes mellitus with other specified complication: Secondary | ICD-10-CM | POA: Diagnosis not present

## 2024-09-23 DIAGNOSIS — I10 Essential (primary) hypertension: Secondary | ICD-10-CM | POA: Diagnosis not present

## 2024-09-23 DIAGNOSIS — E785 Hyperlipidemia, unspecified: Secondary | ICD-10-CM | POA: Insufficient documentation

## 2024-09-23 DIAGNOSIS — I700 Atherosclerosis of aorta: Secondary | ICD-10-CM | POA: Insufficient documentation

## 2024-09-23 DIAGNOSIS — D6869 Other thrombophilia: Secondary | ICD-10-CM | POA: Insufficient documentation

## 2024-09-23 DIAGNOSIS — Z01818 Encounter for other preprocedural examination: Secondary | ICD-10-CM | POA: Diagnosis not present

## 2024-09-23 DIAGNOSIS — N401 Enlarged prostate with lower urinary tract symptoms: Secondary | ICD-10-CM | POA: Insufficient documentation

## 2024-09-23 NOTE — Telephone Encounter (Signed)
   Patient Name: Brian Alejandro, Brian Savage  DOB: [birth year] MRN: [id number]  Primary Cardiologist: Oneil Parchment, MD  Chart reviewed as part of pre-operative protocol coverage. Given past medical history and time since last visit, based on ACC/AHA guidelines, Brian Tod Portland, Brian Savage is at acceptable risk for the planned procedure without further cardiovascular testing.   The patient was advised that if he develops new symptoms prior to surgery to contact our office to arrange for a follow-up visit, and he verbalized understanding.  Per Dr. Parchment: Manuelita to proceed with prostate biopsy with low cardiac risk  Per office protocol, patient can hold Eliquis  for 3 days prior to procedure.     Patient will not need bridging with Lovenox (enoxaparin) around procedure.  I will route this recommendation to the requesting party via Epic fax function and remove from pre-op pool.  Please call with questions.  Lum LITTIE Louis, NP 09/23/2024, 4:48 PM

## 2024-09-23 NOTE — Telephone Encounter (Signed)
 Patient with diagnosis of PE on Eliquis  for anticoagulation.    Procedure:  Prostate Biopsy  Date of procedure: 10/22/24   CrCl 92 ml/min Platelet count 192K   Per office protocol, patient can hold Eliquis  for 3 days prior to procedure.    Patient will not need bridging with Lovenox (enoxaparin) around procedure.  **This guidance is not considered finalized until pre-operative APP has relayed final recommendations.**

## 2024-09-24 DIAGNOSIS — E1169 Type 2 diabetes mellitus with other specified complication: Secondary | ICD-10-CM | POA: Diagnosis not present

## 2024-10-03 DIAGNOSIS — E119 Type 2 diabetes mellitus without complications: Secondary | ICD-10-CM | POA: Diagnosis not present

## 2024-10-03 DIAGNOSIS — H5213 Myopia, bilateral: Secondary | ICD-10-CM | POA: Diagnosis not present

## 2024-10-03 DIAGNOSIS — H2513 Age-related nuclear cataract, bilateral: Secondary | ICD-10-CM | POA: Diagnosis not present

## 2024-10-03 DIAGNOSIS — H52203 Unspecified astigmatism, bilateral: Secondary | ICD-10-CM | POA: Diagnosis not present

## 2024-10-22 ENCOUNTER — Ambulatory Visit: Admit: 2024-10-22 | Admitting: Urology

## 2024-10-22 SURGERY — BIOPSY, PROSTATE
Anesthesia: General

## 2024-12-05 ENCOUNTER — Other Ambulatory Visit: Payer: Self-pay | Admitting: Urology

## 2025-01-21 ENCOUNTER — Ambulatory Visit (HOSPITAL_COMMUNITY): Admit: 2025-01-21 | Admitting: Urology
